# Patient Record
Sex: Female | Born: 1985 | Race: White | Hispanic: No | Marital: Single | Smoking: Former smoker
Health system: Southern US, Community
[De-identification: ages and names within clinical notes are randomized; demographics above are authoritative.]

## PROBLEM LIST (undated history)

## (undated) ENCOUNTER — Inpatient Hospital Stay (HOSPITAL_COMMUNITY): Payer: Self-pay

## (undated) DIAGNOSIS — Z789 Other specified health status: Secondary | ICD-10-CM

## (undated) HISTORY — PX: OTHER SURGICAL HISTORY: SHX169

## (undated) HISTORY — PX: WISDOM TOOTH EXTRACTION: SHX21

---
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
## 2010-03-25 ENCOUNTER — Emergency Department (HOSPITAL_COMMUNITY): Admission: EM | Admit: 2010-03-25 | Discharge: 2010-03-25 | Payer: Self-pay | Admitting: Emergency Medicine

## 2010-09-03 LAB — POCT URINALYSIS DIPSTICK
Bilirubin Urine: NEGATIVE
Ketones, ur: NEGATIVE mg/dL
Protein, ur: NEGATIVE mg/dL
Specific Gravity, Urine: <=1.005 (ref 1.005–1.030)
pH: 6 (ref 5.0–8.0)

## 2010-09-03 LAB — URINE CULTURE
Colony Count: NO GROWTH
Culture  Setup Time: 201110051459

## 2010-09-03 LAB — HIV ANTIBODY (ROUTINE TESTING W REFLEX): HIV: NONREACTIVE

## 2010-09-03 LAB — POCT PREGNANCY, URINE: Preg Test, Ur: NEGATIVE

## 2010-09-03 LAB — HERPES SIMPLEX VIRUS CULTURE: Culture: DETECTED

## 2010-09-03 LAB — RPR: RPR Ser Ql: NONREACTIVE

## 2012-02-16 ENCOUNTER — Telehealth (HOSPITAL_COMMUNITY): Payer: Self-pay | Admitting: *Deleted

## 2012-02-16 NOTE — ED Notes (Signed)
Brandi from the [facility] called and requested pt.'s HSV results from 2 yrs ago- for continuity of care. She asked me to fax it to her. I accessed pt.'s chart and call her the result and faxed it to [phone] and confirmation received.

## 2012-02-18 ENCOUNTER — Ambulatory Visit (INDEPENDENT_AMBULATORY_CARE_PROVIDER_SITE_OTHER): Payer: 59 | Admitting: Family Medicine

## 2012-02-18 VITALS — BP 120/88 | HR 66 | Temp 97.8°F | Resp 16 | Ht 67.5 in | Wt 160.0 lb

## 2012-02-18 DIAGNOSIS — A749 Chlamydial infection, unspecified: Secondary | ICD-10-CM

## 2012-02-18 MED ORDER — AZITHROMYCIN 500 MG PO TABS: ORAL_TABLET | ORAL | Status: DC

## 2012-02-18 NOTE — Patient Instructions (Addendum)
Chlamydia, Female Chlamydia is an infection caused by bacteria. It is spread through sexual contact. Chlamydia can be in different areas of the body. These areas include the cervix, urethra, throat, or rectum. If you are infected, you must finish all treatments and follow up with a caregiver.  CAUSES  Chlamydia is a sexually transmitted disease. It is passed from an infected partner during intimate contact. This contact could be with the genitals, mouth, or rectal area. Infections can also be passed from mothers to babies during birth. SYMPTOMS  There may not be any symptoms. This is often the case early in the infection. Symptoms you may notice include:  Mild pain and discomfort when urinating.   Inflammation of the rectum.   Vaginal discharge.   Painful intercourse.   Abdominal pain.   Bleeding between menstrual periods.  DIAGNOSIS  To diagnose this infection, your caregiver will do a pelvic exam. Cultures will be taken of the vagina, cervix, urine, and possibly the rectum to see if the infection is chlamydia. TREATMENT You will be given antibiotic medicines. Any sexual partners should also be treated, even if they do not show symptoms. Take the medicine for the prescribed length of time. If you are pregnant, do not take tetracycline-type antibiotics. HOME CARE INSTRUCTIONS   Take your antibiotics as directed. Finish them even if you start to feel better.   Only take over-the-counter or prescription medicines for pain, discomfort, or fever as directed by your caregiver.   Inform any sexual partners about the infection. They should be treated also.   Do not have sexual contact until your caregiver tells you it is okay.   Get plenty of rest.   Eat a well-balanced diet, and drink enough fluids to keep your urine clear or pale yellow.   Keep all follow-up appointments and tests.  SEEK IMMEDIATE MEDICAL CARE IF:   Your symptoms return.   You have a fever.  MAKE SURE YOU:    Understand these instructions.   Will watch your condition.   Will get help right away if you are not doing well or get worse.  Document Released: 03/17/2005 Document Revised: 05/27/2011 Document Reviewed: 01/24/2008 Exit[employer] Patient Information 2012 [employer]. 

## 2012-02-18 NOTE — Progress Notes (Signed)
Subjective: 26 year old patient who presents with a history of having Chlamydia exposure. Her sexual contact was treated a few days ago, and she went to the health Department. She tested positive, but they said they could not treat her until next. They did full STD testing on her apparently. He was treated with a single dose therapy regimen already. He is in [place] so they do not have sex regularly.  She is otherwise healthy.  Objective: Pleasant young lady in no acute distress. Exam not done since she just was tested.  Assessment: Chlamydia cervicitis  Plan: Treated her with azithromycin 500 mg, 2 tablets stat  Plan to have her return if further problems

## 2013-05-30 ENCOUNTER — Other Ambulatory Visit (HOSPITAL_COMMUNITY)
Admission: RE | Admit: 2013-05-30 | Discharge: 2013-05-30 | Disposition: A | Discharge status: Home / Self Care | Payer: 59 | Source: Ambulatory Visit | Attending: Obstetrics & Gynecology | Admitting: Obstetrics & Gynecology

## 2013-05-30 ENCOUNTER — Other Ambulatory Visit: Payer: Self-pay | Admitting: Obstetrics & Gynecology

## 2013-05-30 DIAGNOSIS — Z01419 Encounter for gynecological examination (general) (routine) without abnormal findings: Secondary | ICD-10-CM | POA: Insufficient documentation

## 2013-05-30 DIAGNOSIS — Z113 Encounter for screening for infections with a predominantly sexual mode of transmission: Secondary | ICD-10-CM | POA: Insufficient documentation

## 2015-05-07 ENCOUNTER — Inpatient Hospital Stay (HOSPITAL_COMMUNITY): Payer: BC Managed Care – PPO

## 2015-05-07 ENCOUNTER — Inpatient Hospital Stay (HOSPITAL_COMMUNITY)
Admission: AD | Admit: 2015-05-07 | Discharge: 2015-05-07 | Disposition: A | Discharge status: Home / Self Care | Payer: BC Managed Care – PPO | Source: Ambulatory Visit | Attending: Obstetrics & Gynecology | Admitting: Obstetrics & Gynecology

## 2015-05-07 ENCOUNTER — Encounter (HOSPITAL_COMMUNITY): Payer: Self-pay | Admitting: Medical

## 2015-05-07 DIAGNOSIS — R1030 Lower abdominal pain, unspecified: Secondary | ICD-10-CM | POA: Diagnosis not present

## 2015-05-07 DIAGNOSIS — R109 Unspecified abdominal pain: Secondary | ICD-10-CM | POA: Diagnosis not present

## 2015-05-07 DIAGNOSIS — Z3A01 Less than 8 weeks gestation of pregnancy: Secondary | ICD-10-CM | POA: Diagnosis not present

## 2015-05-07 DIAGNOSIS — O9989 Other specified diseases and conditions complicating pregnancy, childbirth and the puerperium: Secondary | ICD-10-CM | POA: Diagnosis not present

## 2015-05-07 DIAGNOSIS — O26891 Other specified pregnancy related conditions, first trimester: Secondary | ICD-10-CM | POA: Diagnosis not present

## 2015-05-07 DIAGNOSIS — O26899 Other specified pregnancy related conditions, unspecified trimester: Secondary | ICD-10-CM

## 2015-05-07 HISTORY — DX: Other specified health status: Z78.9

## 2015-05-07 LAB — CBC WITH DIFFERENTIAL/PLATELET
Basophils Absolute: 0 10*3/uL (ref 0.0–0.1)
Basophils Relative: 0 %
Eosinophils Absolute: 0.1 10*3/uL (ref 0.0–0.7)
Eosinophils Relative: 1 %
HEMATOCRIT: 33.6 % — AB (ref 36.0–46.0)
HEMOGLOBIN: 12 g/dL (ref 12.0–15.0)
LYMPHS ABS: 2.5 10*3/uL (ref 0.7–4.0)
LYMPHS PCT: 28 %
MCH: 30.5 pg (ref 26.0–34.0)
MCHC: 35.7 g/dL (ref 30.0–36.0)
MCV: 85.5 fL (ref 78.0–100.0)
MONO ABS: 0.7 10*3/uL (ref 0.1–1.0)
MONOS PCT: 8 %
NEUTROS ABS: 5.4 10*3/uL (ref 1.7–7.7)
NEUTROS PCT: 63 %
Platelets: 213 10*3/uL (ref 150–400)
RBC: 3.93 MIL/uL (ref 3.87–5.11)
RDW: 12.1 % (ref 11.5–15.5)
WBC: 8.7 10*3/uL (ref 4.0–10.5)

## 2015-05-07 LAB — URINE MICROSCOPIC-ADD ON: Bacteria, UA: NONE SEEN

## 2015-05-07 LAB — URINALYSIS, ROUTINE W REFLEX MICROSCOPIC
Bilirubin Urine: NEGATIVE
GLUCOSE, UA: NEGATIVE mg/dL
Ketones, ur: NEGATIVE mg/dL
NITRITE: NEGATIVE
PH: 7.5 (ref 5.0–8.0)
PROTEIN: NEGATIVE mg/dL
SPECIFIC GRAVITY, URINE: 1.01 (ref 1.005–1.030)

## 2015-05-07 LAB — HCG, QUANTITATIVE, PREGNANCY: HCG, BETA CHAIN, QUANT, S: 11651 m[IU]/mL — AB (ref <5)

## 2015-05-07 LAB — WET PREP, GENITAL
CLUE CELLS WET PREP: NONE SEEN
SPERM: NONE SEEN
Trich, Wet Prep: NONE SEEN
Yeast Wet Prep HPF POC: NONE SEEN

## 2015-05-07 LAB — POCT PREGNANCY, URINE: Preg Test, Ur: POSITIVE — AB

## 2015-05-07 NOTE — MAU Note (Signed)
Pt reports 2 positive home preg tests, lower mid abd pain off/on x 2 days. Urinary freq, denies dysuria.

## 2015-05-07 NOTE — Discharge Instructions (Signed)
First Trimester of Pregnancy The first trimester of pregnancy is from week 1 until the end of week 12 (months 1 through 3). A week after a sperm fertilizes an egg, the egg will implant on the wall of the uterus. This embryo will begin to develop into a baby. Genes from you and your partner are forming the baby. The female genes determine whether the baby is a boy or a girl. At 6-8 weeks, the eyes and face are formed, and the heartbeat can be seen on ultrasound. At the end of 12 weeks, all the baby's organs are formed.  Now that you are pregnant, you will want to do everything you can to have a healthy baby. Two of the most important things are to get good prenatal care and to follow your health care provider's instructions. Prenatal care is all the medical care you receive before the baby's birth. This care will help prevent, find, and treat any problems during the pregnancy and childbirth. BODY CHANGES Your body goes through many changes during pregnancy. The changes vary from woman to woman.   You may gain or lose a couple of pounds at first.  You may feel sick to your stomach (nauseous) and throw up (vomit). If the vomiting is uncontrollable, call your health care provider.  You may tire easily.  You may develop headaches that can be relieved by medicines approved by your health care provider.  You may urinate more often. Painful urination may mean you have a bladder infection.  You may develop heartburn as a result of your pregnancy.  You may develop constipation because certain hormones are causing the muscles that push waste through your intestines to slow down.  You may develop hemorrhoids or swollen, bulging veins (varicose veins).  Your breasts may begin to grow larger and become tender. Your nipples may stick out more, and the tissue that surrounds them (areola) may become darker.  Your gums may bleed and may be sensitive to brushing and flossing.  Dark spots or blotches  (chloasma, mask of pregnancy) may develop on your face. This will likely fade after the baby is born.  Your menstrual periods will stop.  You may have a loss of appetite.  You may develop cravings for certain kinds of food.  You may have changes in your emotions from day to day, such as being excited to be pregnant or being concerned that something may go wrong with the pregnancy and baby.  You may have more vivid and strange dreams.  You may have changes in your hair. These can include thickening of your hair, rapid growth, and changes in texture. Some women also have hair loss during or after pregnancy, or hair that feels dry or thin. Your hair will most likely return to normal after your baby is born. WHAT TO EXPECT AT YOUR PRENATAL VISITS During a routine prenatal visit:  You will be weighed to make sure you and the baby are growing normally.  Your blood pressure will be taken.  Your abdomen will be measured to track your baby's growth.  The fetal heartbeat will be listened to starting around week 10 or 12 of your pregnancy.  Test results from any previous visits will be discussed. Your health care provider may ask you:  How you are feeling.  If you are feeling the baby move.  If you have had any abnormal symptoms, such as leaking fluid, bleeding, severe headaches, or abdominal cramping.  If you are using any tobacco products,   including cigarettes, chewing tobacco, and electronic cigarettes.  If you have any questions. Other tests that may be performed during your first trimester include:  Blood tests to find your blood type and to check for the presence of any previous infections. They will also be used to check for low iron levels (anemia) and Rh antibodies. Later in the pregnancy, blood tests for diabetes will be done along with other tests if problems develop.  Urine tests to check for infections, diabetes, or protein in the urine.  An ultrasound to confirm the  proper growth and development of the baby.  An amniocentesis to check for possible genetic problems.  Fetal screens for spina bifida and Down syndrome.  You may need other tests to make sure you and the baby are doing well.  HIV (human immunodeficiency virus) testing. Routine prenatal testing includes screening for HIV, unless you choose not to have this test. HOME CARE INSTRUCTIONS  Medicines  Follow your health care provider's instructions regarding medicine use. Specific medicines may be either safe or unsafe to take during pregnancy.  Take your prenatal vitamins as directed.  If you develop constipation, try taking a stool softener if your health care provider approves. Diet  Eat regular, well-balanced meals. Choose a variety of foods, such as meat or vegetable-based protein, fish, milk and low-fat dairy products, vegetables, fruits, and whole grain breads and cereals. Your health care provider will help you determine the amount of weight gain that is right for you.  Avoid raw meat and uncooked cheese. These carry germs that can cause birth defects in the baby.  Eating four or five small meals rather than three large meals a day may help relieve nausea and vomiting. If you start to feel nauseous, eating a few soda crackers can be helpful. Drinking liquids between meals instead of during meals also seems to help nausea and vomiting.  If you develop constipation, eat more high-fiber foods, such as fresh vegetables or fruit and whole grains. Drink enough fluids to keep your urine clear or pale yellow. Activity and Exercise  Exercise only as directed by your health care provider. Exercising will help you:  Control your weight.  Stay in shape.  Be prepared for labor and delivery.  Experiencing pain or cramping in the lower abdomen or low back is a good sign that you should stop exercising. Check with your health care provider before continuing normal exercises.  Try to avoid  standing for long periods of time. Move your legs often if you must stand in one place for a long time.  Avoid heavy lifting.  Wear low-heeled shoes, and practice good posture.  You may continue to have sex unless your health care provider directs you otherwise. Relief of Pain or Discomfort  Wear a good support bra for breast tenderness.   Take warm sitz baths to soothe any pain or discomfort caused by hemorrhoids. Use hemorrhoid cream if your health care provider approves.   Rest with your legs elevated if you have leg cramps or low back pain.  If you develop varicose veins in your legs, wear support hose. Elevate your feet for 15 minutes, 3-4 times a day. Limit salt in your diet. Prenatal Care  Schedule your prenatal visits by the twelfth week of pregnancy. They are usually scheduled monthly at first, then more often in the last 2 months before delivery.  Write down your questions. Take them to your prenatal visits.  Keep all your prenatal visits as directed by your   health care provider. Safety  Wear your seat belt at all times when driving.  Make a list of emergency phone numbers, including numbers for family, friends, the hospital, and police and fire departments. General Tips  Ask your health care provider for a referral to a local prenatal education class. Begin classes no later than at the beginning of month 6 of your pregnancy.  Ask for help if you have counseling or nutritional needs during pregnancy. Your health care provider can offer advice or refer you to specialists for help with various needs.  Do not use hot tubs, steam rooms, or saunas.  Do not douche or use tampons or scented sanitary pads.  Do not cross your legs for long periods of time.  Avoid cat litter boxes and soil used by cats. These carry germs that can cause birth defects in the baby and possibly loss of the fetus by miscarriage or stillbirth.  Avoid all smoking, herbs, alcohol, and medicines  not prescribed by your health care provider. Chemicals in these affect the formation and growth of the baby.  Do not use any tobacco products, including cigarettes, chewing tobacco, and electronic cigarettes. If you need help quitting, ask your health care provider. You may receive counseling support and other resources to help you quit.  Schedule a dentist appointment. At home, brush your teeth with a soft toothbrush and be gentle when you floss. SEEK MEDICAL CARE IF:   You have dizziness.  You have mild pelvic cramps, pelvic pressure, or nagging pain in the abdominal area.  You have persistent nausea, vomiting, or diarrhea.  You have a bad smelling vaginal discharge.  You have pain with urination.  You notice increased swelling in your face, hands, legs, or ankles. SEEK IMMEDIATE MEDICAL CARE IF:   You have a fever.  You are leaking fluid from your vagina.  You have spotting or bleeding from your vagina.  You have severe abdominal cramping or pain.  You have rapid weight gain or loss.  You vomit blood or material that looks like coffee grounds.  You are exposed to German measles and have never had them.  You are exposed to fifth disease or chickenpox.  You develop a severe headache.  You have shortness of breath.  You have any kind of trauma, such as from a fall or a car accident.   This information is not intended to replace advice given to you by your health care provider. Make sure you discuss any questions you have with your health care provider.   Document Released: 06/01/2001 Document Revised: 06/28/2014 Document Reviewed: 04/17/2013 Elsevier Interactive Patient Education 2016 Else[employer].  Safe Medications in Pregnancy   Acne: Benzoyl Peroxide Salicylic Acid  Backache/Headache: Tylenol: 2 regular strength every 4 hours OR              2 Extra strength every 6 hours  Colds/Coughs/Allergies: Benadryl (alcohol free) 25 mg every 6 hours as  needed Breath right strips Claritin Cepacol throat lozenges Chloraseptic throat spray Cold-Eeze- up to three times per day Cough drops, alcohol free Flonase (by prescription only) Guaifenesin Mucinex Robitussin DM (plain only, alcohol free) Saline nasal spray/drops Sudafed (pseudoephedrine) & Actifed ** use only after [redacted] weeks gestation and if you do not have high blood pressure Tylenol Vicks Vaporub Zinc lozenges Zyrtec   Constipation: Colace Ducolax suppositories Fleet enema Glycerin suppositories Metamucil Milk of magnesia Miralax Senokot Smooth move tea  Diarrhea: Kaopectate Imodium A-D  *NO pepto Bismol  Hemorrhoids: Anusol Anusol   HC Preparation H Tucks  Indigestion: Tums Maalox Mylanta Zantac  Pepcid  Insomnia: Benadryl (alcohol free) 25mg every 6 hours as needed Tylenol PM Unisom, no Gelcaps  Leg Cramps: Tums MagGel  Nausea/Vomiting:  Bonine Dramamine Emetrol Ginger extract Sea bands Meclizine  Nausea medication to take during pregnancy:  Unisom (doxylamine succinate 25 mg tablets) Take one tablet daily at bedtime. If symptoms are not adequately controlled, the dose can be increased to a maximum recommended dose of two tablets daily (1/2 tablet in the morning, 1/2 tablet mid-afternoon and one at bedtime). Vitamin B6 100mg tablets. Take one tablet twice a day (up to 200 mg per day).  Skin Rashes: Aveeno products Benadryl cream or 25mg every 6 hours as needed Calamine Lotion 1% cortisone cream  Yeast infection: Gyne-lotrimin 7 Monistat 7   **If taking multiple medications, please check labels to avoid duplicating the same active ingredients **take medication as directed on the label ** Do not exceed 4000 mg of tylenol in 24 hours **Do not take medications that contain aspirin or ibuprofen    

## 2015-05-07 NOTE — MAU Provider Note (Signed)
History     CSN: [id number]  Arrival date and time: 05/07/15 1844   First Provider Initiated Contact with Patient 05/07/15 1929      No chief complaint on file.  HPI  [name] is a 29 y.o. G1P0 at [redacted]w[redacted]d who presents to MAU today with complaint of lower abdominal pain x 2-3 days. The patient had recent +HPT. She denies abnormal discharge, vaginal bleeding, UTI symptoms, N/V/D or constipation or fever. She rates pain at 5/10 now. States it has been intermittent since onset. She has not taken anything for pain.   OB History    Gravida Para Term Preterm AB TAB SAB Ectopic Multiple Living   1               No past medical history on file.  No past surgical history on file.  No family history on file.  Social History  Substance Use Topics  . Smoking status: Former Games developer  . Smokeless tobacco: Not on file  . Alcohol Use: Not on file    Allergies: No Known Allergies  Prescriptions prior to admission  Medication Sig Dispense Refill Last Dose  . azithromycin (ZITHROMAX) 500 MG tablet Take 2 tablets single dose for chlamydia treatment 2 tablet 0   . escitalopram (LEXAPRO) 10 MG tablet Take 10 mg by mouth daily.   Taking    Review of Systems  Constitutional: Negative for fever and malaise/fatigue.  Gastrointestinal: Positive for abdominal pain. Negative for nausea, vomiting, diarrhea and constipation.  Genitourinary: Negative for dysuria, urgency and frequency.       Neg - vaginal bleeding, discharge   Physical Exam   Blood pressure 119/90, pulse 81, temperature 98.4 F (36.9 C), temperature source Oral, resp. rate 16, last menstrual period 03/24/2015, SpO2 100 %.  Physical Exam  Nursing note and vitals reviewed. Constitutional: She is oriented to person, place, and time. She appears well-developed and well-nourished. No distress.  HENT:  Head: Normocephalic and atraumatic.  Cardiovascular: Normal rate.   Respiratory: Effort normal.  GI: Soft. She  exhibits no distension and no mass. There is no tenderness. There is no rebound and no guarding.  Neurological: She is alert and oriented to person, place, and time.  Skin: Skin is warm and dry. No erythema.  Psychiatric: She has a normal mood and affect.   Results for orders placed or performed during the hospital encounter of 05/07/15 (from the past 24 hour(s))  Urinalysis, Routine w reflex microscopic (not at [facility])     Status: Abnormal   Collection Time: 05/07/15  7:31 PM  Result Value Ref Range   Color, Urine YELLOW YELLOW   APPearance CLEAR CLEAR   Specific Gravity, Urine 1.010 1.005 - 1.030   pH 7.5 5.0 - 8.0   Glucose, UA NEGATIVE NEGATIVE mg/dL   Hgb urine dipstick TRACE (A) NEGATIVE   Bilirubin Urine NEGATIVE NEGATIVE   Ketones, ur NEGATIVE NEGATIVE mg/dL   Protein, ur NEGATIVE NEGATIVE mg/dL   Nitrite NEGATIVE NEGATIVE   Leukocytes, UA SMALL (A) NEGATIVE  Urine microscopic-add on     Status: Abnormal   Collection Time: 05/07/15  7:31 PM  Result Value Ref Range   Squamous Epithelial / LPF 0-5 (A) NONE SEEN   WBC, UA 0-5 0 - 5 WBC/hpf   RBC / HPF 0-5 0 - 5 RBC/hpf   Bacteria, UA NONE SEEN NONE SEEN  Pregnancy, urine POC     Status: Abnormal   Collection Time: 05/07/15  7:40 PM  Result Value Ref Range   Preg Test, Ur POSITIVE (A) NEGATIVE  CBC with Differential/Platelet     Status: Abnormal   Collection Time: 05/07/15  7:49 PM  Result Value Ref Range   WBC 8.7 4.0 - 10.5 K/uL   RBC 3.93 3.87 - 5.11 MIL/uL   Hemoglobin 12.0 12.0 - 15.0 g/dL   HCT 16.1 (L) 09.6 - 04.5 %   MCV 85.5 78.0 - 100.0 fL   MCH 30.5 26.0 - 34.0 pg   MCHC 35.7 30.0 - 36.0 g/dL   RDW 40.9 81.1 - 91.4 %   Platelets 213 150 - 400 K/uL   Neutrophils Relative % 63 %   Neutro Abs 5.4 1.7 - 7.7 K/uL   Lymphocytes Relative 28 %   Lymphs Abs 2.5 0.7 - 4.0 K/uL   Monocytes Relative 8 %   Monocytes Absolute 0.7 0.1 - 1.0 K/uL   Eosinophils Relative 1 %   Eosinophils Absolute 0.1 0.0 - 0.7 K/uL    Basophils Relative 0 %   Basophils Absolute 0.0 0.0 - 0.1 K/uL  ABO/Rh     Status: None (Preliminary result)   Collection Time: 05/07/15  7:49 PM  Result Value Ref Range   ABO/RH(D) O POS   hCG, quantitative, pregnancy     Status: Abnormal   Collection Time: 05/07/15  7:49 PM  Result Value Ref Range   hCG, Beta Chain, Quant, S 11651 (H) <5 mIU/mL  Wet prep, genital     Status: Abnormal   Collection Time: 05/07/15  8:50 PM  Result Value Ref Range   Yeast Wet Prep HPF POC NONE SEEN NONE SEEN   Trich, Wet Prep NONE SEEN NONE SEEN   Clue Cells Wet Prep HPF POC NONE SEEN NONE SEEN   WBC, Wet Prep HPF POC MANY (A) NONE SEEN   Sperm NONE SEEN    US Ob Comp Less 14 Wks  05/07/2015  CLINICAL DATA:  Acute onset of lower abdominal pain. Initial encounter. EXAM: OBSTETRIC <14 WK [place] AND TRANSVAGINAL OB US TECHNIQUE: Both transabdominal and transvaginal ultrasound examinations were performed for complete evaluation of the gestation as well as the maternal uterus, adnexal regions, and pelvic cul-de-sac. Transvaginal technique was performed to assess early pregnancy. COMPARISON:  None. FINDINGS: Intrauterine gestational sac: Visualized/normal in shape. Yolk sac:  Yes Embryo:  No Cardiac Activity: N/A MSD: 1.10 cm   5 w   6  d Maternal uterus/adnexae: A small amount of subchorionic hemorrhage is noted, measuring 3.3 x 2.2 x 0.7 cm. The uterus is otherwise unremarkable in appearance. The ovaries are within normal limits. The right ovary measures 3.7 x 2.2 x 2.3 cm, while the left ovary measures 3.4 x 1.8 x 2.1 cm. No suspicious adnexal masses are seen; there is no evidence for ovarian torsion. No free fluid is seen in the pelvic cul-de-sac. IMPRESSION: 1. Single intrauterine gestational sac noted, with a mean sac diameter of 1.1 cm, corresponding to a gestational age of [redacted] weeks 6 days. This matches the gestational age by LMP of 6 weeks 2 days, reflecting an estimated date of delivery of December 29, 2015. 2. Small  amount of subchorionic hemorrhage noted. Electronically Signed   By: Roanna Raider M.D.   On: 05/07/2015 23:10   US Ob Transvaginal  05/07/2015  CLINICAL DATA:  Acute onset of lower abdominal pain. Initial encounter. EXAM: OBSTETRIC <14 WK [place] AND TRANSVAGINAL OB US TECHNIQUE: Both transabdominal and transvaginal ultrasound examinations were performed for complete evaluation of  the gestation as well as the maternal uterus, adnexal regions, and pelvic cul-de-sac. Transvaginal technique was performed to assess early pregnancy. COMPARISON:  None. FINDINGS: Intrauterine gestational sac: Visualized/normal in shape. Yolk sac:  Yes Embryo:  No Cardiac Activity: N/A MSD: 1.10 cm   5 w   6  d Maternal uterus/adnexae: A small amount of subchorionic hemorrhage is noted, measuring 3.3 x 2.2 x 0.7 cm. The uterus is otherwise unremarkable in appearance. The ovaries are within normal limits. The right ovary measures 3.7 x 2.2 x 2.3 cm, while the left ovary measures 3.4 x 1.8 x 2.1 cm. No suspicious adnexal masses are seen; there is no evidence for ovarian torsion. No free fluid is seen in the pelvic cul-de-sac. IMPRESSION: 1. Single intrauterine gestational sac noted, with a mean sac diameter of 1.1 cm, corresponding to a gestational age of [redacted] weeks 6 days. This matches the gestational age by LMP of 6 weeks 2 days, reflecting an estimated date of delivery of December 29, 2015. 2. Small amount of subchorionic hemorrhage noted. Electronically Signed   By: Roanna RaiderJeffery  Chang M.D.   On: 05/07/2015 23:10     MAU Course  Procedures None  MDM +UPT UA, wet prep, GC/chlamydia, CBC, ABO/Rh, quant hCG, HIV, RPR and US today to rule out ectopic pregnancy 2025 - patient waiting for US. [facility] CNM  Marny LowensteinJulie N Wenzel, PA-C  05/07/2015, 8:27 PM  Assessment and Plan   1. Abdominal pain affecting pregnancy, antepartum    DC home Comfort measures reviewed  1st Trimester precautions  Bleeding precautions RX:  none  Return to MAU as needed FU with OB as planned  Follow-up Information    [facility]   Contact information:   [address] [place] [id number] [phone]

## 2015-05-08 LAB — ABO/RH: ABO/RH(D): O POS

## 2015-05-08 LAB — GC/CHLAMYDIA PROBE AMP (~~LOC~~) NOT AT ARMC
CHLAMYDIA, DNA PROBE: NEGATIVE
NEISSERIA GONORRHEA: NEGATIVE

## 2015-05-08 LAB — RPR: RPR: NONREACTIVE

## 2015-05-08 LAB — HIV ANTIBODY (ROUTINE TESTING W REFLEX): HIV SCREEN 4TH GENERATION: NONREACTIVE

## 2015-06-22 NOTE — L&D Delivery Note (Signed)
Delivery Note At 3:27 PM a viable female was delivered via  (Presentation vertex: ; LOA ).  APGAR:8 , 9; weight  .   Placenta status:spont , 3vc.  Cord:  with the following complications:none .  Cord pH: n/a  Anesthesia:  epidural Episiotomy:  none Lacerations:  none Suture Repair: n/a Est. Blood Loss 100 (mL):    Mom to postpartum.  Baby to Couplet care / Skin to Skin.  Claudia Graves 12/28/2015, 3:39 PM

## 2015-07-17 ENCOUNTER — Inpatient Hospital Stay (HOSPITAL_COMMUNITY)
Admission: AD | Admit: 2015-07-17 | Discharge: 2015-07-17 | Disposition: A | Discharge status: Home / Self Care | Payer: BC Managed Care – PPO | Source: Ambulatory Visit | Attending: Obstetrics & Gynecology | Admitting: Obstetrics & Gynecology

## 2015-07-17 ENCOUNTER — Encounter (HOSPITAL_COMMUNITY): Payer: Self-pay | Admitting: *Deleted

## 2015-07-17 DIAGNOSIS — Z3A16 16 weeks gestation of pregnancy: Secondary | ICD-10-CM | POA: Insufficient documentation

## 2015-07-17 DIAGNOSIS — O26852 Spotting complicating pregnancy, second trimester: Secondary | ICD-10-CM | POA: Insufficient documentation

## 2015-07-17 DIAGNOSIS — N939 Abnormal uterine and vaginal bleeding, unspecified: Secondary | ICD-10-CM | POA: Diagnosis present

## 2015-07-17 DIAGNOSIS — Z87891 Personal history of nicotine dependence: Secondary | ICD-10-CM | POA: Insufficient documentation

## 2015-07-17 LAB — URINE MICROSCOPIC-ADD ON

## 2015-07-17 LAB — URINALYSIS, ROUTINE W REFLEX MICROSCOPIC
BILIRUBIN URINE: NEGATIVE
Glucose, UA: NEGATIVE mg/dL
Ketones, ur: NEGATIVE mg/dL
NITRITE: NEGATIVE
PROTEIN: NEGATIVE mg/dL
pH: 6.5 (ref 5.0–8.0)

## 2015-07-17 LAB — WET PREP, GENITAL
Sperm: NONE SEEN
Trich, Wet Prep: NONE SEEN
Yeast Wet Prep HPF POC: NONE SEEN

## 2015-07-17 LAB — OB RESULTS CONSOLE GC/CHLAMYDIA: Gonorrhea: NEGATIVE

## 2015-07-17 NOTE — Discharge Instructions (Signed)
Vaginal Bleeding During Pregnancy, Second Trimester A small amount of bleeding (spotting) from the vagina is relatively common in pregnancy. It usually stops on its own. Various things can cause bleeding or spotting in pregnancy. Some bleeding may be related to the pregnancy, and some may not. Sometimes the bleeding is normal and is not a problem. However, bleeding can also be a sign of something serious. Be sure to tell your health care provider about any vaginal bleeding right away. Some possible causes of vaginal bleeding during the second trimester include:  Infection, inflammation, or growths on the cervix.   The placenta may be partially or completely covering the opening of the cervix inside the uterus (placenta previa).  The placenta may have separated from the uterus (abruption of the placenta).   You may be having early (preterm) labor.   The cervix may not be strong enough to keep a baby inside the uterus (cervical insufficiency).   Tiny cysts may have developed in the uterus instead of pregnancy tissue (molar pregnancy). HOME CARE INSTRUCTIONS  Watch your condition for any changes. The following actions may help to lessen any discomfort you are feeling:  Follow your health care provider's instructions for limiting your activity. If your health care provider orders bed rest, you may need to stay in bed and only get up to use the bathroom. However, your health care provider may allow you to continue light activity.  If needed, make plans for someone to help with your regular activities and responsibilities while you are on bed rest.  Keep track of the number of pads you use each day, how often you change pads, and how soaked (saturated) they are. Write this down.  Do not use tampons. Do not douche.  Do not have sexual intercourse or orgasms until approved by your health care provider.  If you pass any tissue from your vagina, save the tissue so you can show it to your  health care provider.  Only take over-the-counter or prescription medicines as directed by your health care provider.  Do not take aspirin because it can make you bleed.  Do not exercise or perform any strenuous activities or heavy lifting without your health care provider's permission.  Keep all follow-up appointments as directed by your health care provider. SEEK MEDICAL CARE IF:  You have any vaginal bleeding during any part of your pregnancy.  You have cramps or labor pains.  You have a fever, not controlled by medicine. SEEK IMMEDIATE MEDICAL CARE IF:   You have severe cramps in your back or belly (abdomen).  You have contractions.  You have chills.  You pass large clots or tissue from your vagina.  Your bleeding increases.  You feel light-headed or weak, or you have fainting episodes.  You are leaking fluid or have a gush of fluid from your vagina. MAKE SURE YOU:  Understand these instructions.  Will watch your condition.  Will get help right away if you are not doing well or get worse.   This information is not intended to replace advice given to you by your health care provider. Make sure you discuss any questions you have with your health care provider.   Document Released: 03/17/2005 Document Revised: 06/12/2013 Document Reviewed: 02/12/2013 Elsevier Interactive Patient Education 2016 [employer]  

## 2015-07-17 NOTE — MAU Note (Signed)
PT SAYS SHE STARTED  HAVING  VAG  BLEEDING-   STARTED  AT 7PM-  WENT  TO B-ROOM-  WHEN  SHE  WIPED  - RED  BLOOD  ON  PAPER.      HAS AN APPOINTMENT   FOR  2-1  IN CLINIC.    IN TRIAGE    - NO   PAD - HAS ON  BLACK UNDERWEAR-   SAYS  SHE  FEELS   2 SPOTS  - UNSURE IF  BLOOD.       NO PAIN   NOW  - HAD A PAIN ON WAY  TO HOSPITAL.      LAST SEX-  3 WEEKS  AGO.

## 2015-07-17 NOTE — MAU Provider Note (Signed)
History     CSN: [id number]  Arrival date and time: 07/17/15 1956   First Provider Initiated Contact with Patient 07/17/15 2227      Chief Complaint  Patient presents with  . Vaginal Bleeding   Vaginal Bleeding The patient's primary symptoms include vaginal bleeding. The patient's pertinent negatives include no pelvic pain. This is a new problem. The current episode started today. The problem occurs rarely. The problem has been resolved. The patient is experiencing no pain. She is pregnant. Pertinent negatives include no abdominal pain, chills, constipation, diarrhea, dysuria, fever, frequency, nausea, urgency or vomiting. The vaginal discharge was bloody. The vaginal bleeding is spotting. She has not been passing clots. She has not been passing tissue. Nothing aggravates the symptoms. She has tried nothing for the symptoms. The treatment provided no relief. Sexual activity: No intercourse in last 24 hours     Past Medical History  Diagnosis Date  . Medical history non-contributory     Past Surgical History  Procedure Laterality Date  . Wisdom tooth extraction    . Other surgical history      gum tissue cliped between teeth    No family history on file.  Social History  Substance Use Topics  . Smoking status: Former Games developer  . Smokeless tobacco: None  . Alcohol Use: No    Allergies: No Known Allergies  Prescriptions prior to admission  Medication Sig Dispense Refill Last Dose  . acetaminophen (TYLENOL) 325 MG tablet Take 325 mg by mouth every 6 (six) hours as needed for headache.   Past Week at Unknown time  . Prenatal Vit-Fe Fumarate-FA (PRENATAL MULTIVITAMIN) TABS tablet Take 1 tablet by mouth daily at 12 noon.   07/16/2015 at Unknown time    Review of Systems  Constitutional: Negative for fever and chills.  Gastrointestinal: Negative for nausea, vomiting, abdominal pain, diarrhea and constipation.  Genitourinary: Positive for vaginal bleeding. Negative for dysuria,  urgency, frequency and pelvic pain.   Physical Exam   Blood pressure 105/72, pulse 81, temperature 98 F (36.7 C), temperature source Oral, resp. rate 20, height  (1.676 m), weight 80.4 kg (177 lb 4 oz), last menstrual period 03/24/2015.  Physical Exam  Nursing note and vitals reviewed. Constitutional: She is oriented to person, place, and time. She appears well-developed and well-nourished. No distress.  HENT:  Head: Normocephalic.  Cardiovascular: Normal rate.   Respiratory: Effort normal.  GI: Soft. There is no tenderness. There is no rebound.  Genitourinary:   External: no lesion Vagina: small amount of pink discharge Cervix: pink, smooth, no CMT Uterus: AGA, FHT 152 with doppler     Neurological: She is alert and oriented to person, place, and time.  Skin: Skin is warm and dry.  Psychiatric: She has a normal mood and affect.     Results for orders placed or performed during the hospital encounter of 07/17/15 (from the past 24 hour(s))  Urinalysis, Routine w reflex microscopic (not at [facility])     Status: Abnormal   Collection Time: 07/17/15  8:17 PM  Result Value Ref Range   Color, Urine YELLOW YELLOW   APPearance CLEAR CLEAR   Specific Gravity, Urine <1.005 (L) 1.005 - 1.030   pH 6.5 5.0 - 8.0   Glucose, UA NEGATIVE NEGATIVE mg/dL   Hgb urine dipstick MODERATE (A) NEGATIVE   Bilirubin Urine NEGATIVE NEGATIVE   Ketones, ur NEGATIVE NEGATIVE mg/dL   Protein, ur NEGATIVE NEGATIVE mg/dL   Nitrite NEGATIVE NEGATIVE   Leukocytes, UA  SMALL (A) NEGATIVE  Urine microscopic-add on     Status: Abnormal   Collection Time: 07/17/15  8:17 PM  Result Value Ref Range   Squamous Epithelial / LPF 0-5 (A) NONE SEEN   WBC, UA 6-30 0 - 5 WBC/hpf   RBC / HPF 0-5 0 - 5 RBC/hpf   Bacteria, UA FEW (A) NONE SEEN  Wet prep, genital     Status: Abnormal   Collection Time: 07/17/15 10:38 PM  Result Value Ref Range   Yeast Wet Prep HPF POC NONE SEEN NONE SEEN   Trich, Wet Prep NONE  SEEN NONE SEEN   Clue Cells Wet Prep HPF POC PRESENT (A) NONE SEEN   WBC, Wet Prep HPF POC MANY (A) NONE SEEN   Sperm NONE SEEN     MAU Course  Procedures  MDM   Assessment and Plan   1. Spotting affecting pregnancy in second trimester    DC home Comfort measures reviewed  2nd Trimester precautions  Bleeding precautions RX: none  Return to MAU as needed FU with OB as planned  Follow-up Information    Follow up with [facility].   Specialty:  Obstetrics and Gynecology   Why:  As scheduled   Contact information:   [address] [place] [address] [phone]        Tawnya Crook 07/17/2015, 10:32 PM

## 2015-07-18 LAB — GC/CHLAMYDIA PROBE AMP (~~LOC~~) NOT AT ARMC
Chlamydia: NEGATIVE
NEISSERIA GONORRHEA: NEGATIVE

## 2015-07-23 ENCOUNTER — Encounter: Payer: Self-pay | Admitting: Advanced Practice Midwife

## 2015-07-23 ENCOUNTER — Ambulatory Visit (INDEPENDENT_AMBULATORY_CARE_PROVIDER_SITE_OTHER): Payer: BC Managed Care – PPO | Admitting: Advanced Practice Midwife

## 2015-07-23 VITALS — BP 121/85 | HR 86 | Temp 98.0°F | Wt 175.0 lb

## 2015-07-23 DIAGNOSIS — Z8619 Personal history of other infectious and parasitic diseases: Secondary | ICD-10-CM | POA: Insufficient documentation

## 2015-07-23 DIAGNOSIS — Z3480 Encounter for supervision of other normal pregnancy, unspecified trimester: Secondary | ICD-10-CM | POA: Insufficient documentation

## 2015-07-23 DIAGNOSIS — Z3482 Encounter for supervision of other normal pregnancy, second trimester: Secondary | ICD-10-CM

## 2015-07-23 LAB — POCT URINALYSIS DIP (DEVICE)
BILIRUBIN URINE: NEGATIVE
GLUCOSE, UA: NEGATIVE mg/dL
KETONES UR: NEGATIVE mg/dL
NITRITE: NEGATIVE
Protein, ur: NEGATIVE mg/dL
Specific Gravity, Urine: 1.02 (ref 1.005–1.030)
Urobilinogen, UA: 0.2 mg/dL (ref 0.0–1.0)
pH: 6.5 (ref 5.0–8.0)

## 2015-07-23 NOTE — Progress Notes (Signed)
Subjective:  Claudia Graves is a 30 y.o. G3P1011 at [redacted]w[redacted]d being seen today for initial prenatal visit.  She is currently monitored for the following issues for this low-risk pregnancy and has Supervision of normal subsequent pregnancy and Hx of herpes genitalis on her problem list.  Patient reports no complaints.  Contractions: Not present. Vag. Bleeding: None.  Movement: Absent. Denies leaking of fluid.   The following portions of the patient's history were reviewed and updated as appropriate: allergies, current medications, past family history, past medical history, past social history, past surgical history and problem list. Problem list updated.  Objective:   Filed Vitals:   07/23/15 0817  BP: 121/85  Pulse: 86  Temp: 98 F (36.7 C)  Weight: 175 lb (79.379 kg)    Fetal Status: Fetal Heart Rate (bpm): 140 Fundal Height: 16 cm Movement: Absent     General:  Alert, oriented and cooperative. Patient is in no acute distress.  Skin: Skin is warm and dry. No rash noted.   Cardiovascular: Normal heart rate noted  Respiratory: Normal respiratory effort, no problems with respiration noted  Abdomen: Soft, gravid, appropriate for gestational age. Pain/Pressure: Absent     Pelvic: Vag. Bleeding: None     Cervical exam deferred       Last Pap 2015  Extremities: Normal range of motion.  Edema: None  Mental Status: Normal mood and affect. Normal behavior. Normal judgment and thought content.   Urinalysis:      Assessment and Plan:  Pregnancy: G3P1011 at [redacted]w[redacted]d  1. Supervision of normal pregnancy, antepartum, second trimester  - Antibody screen; Future - Hepatitis B Surface AntiGEN - Rubella Antibody, IgM - Prescript Monitor Profile(19) - Culture, OB Urine - [place] MFM OB COMP + 14 WK; Future - Antibody screen  --Declines Quad/AFP, anatomy US ordered  Preterm labor symptoms and general obstetric precautions including but not limited to vaginal bleeding, contractions, leaking of fluid  and fetal movement were reviewed in detail with the patient. Please refer to After Visit Summary for other counseling recommendations.  No Follow-up on file.   Hurshel Party, CNM

## 2015-07-23 NOTE — Progress Notes (Signed)
Last pap Dec 2015 normal per patient.  Pt had bleeding last week, went to MAU.  Pt refuses flu shot.

## 2015-07-24 LAB — PRESCRIPTION MONITORING PROFILE (19 PANEL)
Amphetamine/Meth: NEGATIVE ng/mL
BENZODIAZEPINE SCREEN, URINE: NEGATIVE ng/mL
Barbiturate Screen, Urine: NEGATIVE ng/mL
Buprenorphine, Urine: NEGATIVE ng/mL
CARISOPRODOL, URINE: NEGATIVE ng/mL
COCAINE METABOLITES: NEGATIVE ng/mL
Cannabinoid Scrn, Ur: NEGATIVE ng/mL
Creatinine, Urine: 209.18 mg/dL (ref >20.0)
ECSTASY: NEGATIVE ng/mL
FENTANYL URINE: NEGATIVE ng/mL
MEPERIDINE UR: NEGATIVE ng/mL
Methadone Screen, Urine: NEGATIVE ng/mL
Methaqualone: NEGATIVE ng/mL
NITRITES URINE, INITIAL: NEGATIVE ug/mL
OXYCODONE SCRN UR: NEGATIVE ng/mL
Opiate Screen, Urine: NEGATIVE ng/mL
PH URINE, INITIAL: 6.7 pH (ref 4.5–8.9)
PROPOXYPHENE: NEGATIVE ng/mL
Phencyclidine, Ur: NEGATIVE ng/mL
TRAMADOL UR: NEGATIVE ng/mL
Tapentadol, urine: NEGATIVE ng/mL
ZOLPIDEM, URINE: NEGATIVE ng/mL

## 2015-07-24 LAB — HEPATITIS B SURFACE ANTIGEN: Hepatitis B Surface Ag: NEGATIVE

## 2015-07-24 LAB — ANTIBODY SCREEN: ANTIBODY SCREEN: NEGATIVE

## 2015-07-25 LAB — CULTURE, OB URINE

## 2015-07-26 LAB — RUBELLA ANTIBODY, IGM: Rubella IgM: 0.61 (ref <0.91)

## 2015-08-19 ENCOUNTER — Ambulatory Visit (HOSPITAL_COMMUNITY)
Admission: RE | Admit: 2015-08-19 | Discharge: 2015-08-19 | Disposition: A | Discharge status: Home / Self Care | Payer: BC Managed Care – PPO | Source: Ambulatory Visit | Attending: Advanced Practice Midwife | Admitting: Advanced Practice Midwife

## 2015-08-19 ENCOUNTER — Other Ambulatory Visit: Payer: Self-pay | Admitting: Advanced Practice Midwife

## 2015-08-19 DIAGNOSIS — Z36 Encounter for antenatal screening of mother: Secondary | ICD-10-CM | POA: Insufficient documentation

## 2015-08-19 DIAGNOSIS — Z3482 Encounter for supervision of other normal pregnancy, second trimester: Secondary | ICD-10-CM

## 2015-08-19 DIAGNOSIS — Z3A21 21 weeks gestation of pregnancy: Secondary | ICD-10-CM | POA: Diagnosis not present

## 2015-08-19 DIAGNOSIS — O4442 Low lying placenta NOS or without hemorrhage, second trimester: Secondary | ICD-10-CM

## 2015-08-20 ENCOUNTER — Ambulatory Visit (INDEPENDENT_AMBULATORY_CARE_PROVIDER_SITE_OTHER): Payer: BC Managed Care – PPO | Admitting: Obstetrics and Gynecology

## 2015-08-20 VITALS — BP 111/75 | HR 86 | Temp 98.2°F | Wt 182.4 lb

## 2015-08-20 DIAGNOSIS — Z3482 Encounter for supervision of other normal pregnancy, second trimester: Secondary | ICD-10-CM | POA: Diagnosis not present

## 2015-08-20 DIAGNOSIS — O4442 Low lying placenta NOS or without hemorrhage, second trimester: Secondary | ICD-10-CM | POA: Insufficient documentation

## 2015-08-20 DIAGNOSIS — Z113 Encounter for screening for infections with a predominantly sexual mode of transmission: Secondary | ICD-10-CM

## 2015-08-20 LAB — POCT URINALYSIS DIP (DEVICE)
BILIRUBIN URINE: NEGATIVE
Bilirubin Urine: NEGATIVE
Glucose, UA: NEGATIVE mg/dL
Glucose, UA: NEGATIVE mg/dL
HGB URINE DIPSTICK: NEGATIVE
HGB URINE DIPSTICK: NEGATIVE
KETONES UR: NEGATIVE mg/dL
KETONES UR: NEGATIVE mg/dL
Leukocytes, UA: NEGATIVE
Leukocytes, UA: NEGATIVE
Nitrite: NEGATIVE
Nitrite: NEGATIVE
PH: 7 (ref 5.0–8.0)
PROTEIN: NEGATIVE mg/dL
PROTEIN: NEGATIVE mg/dL
SPECIFIC GRAVITY, URINE: 1.025 (ref 1.005–1.030)
SPECIFIC GRAVITY, URINE: 1.02 (ref 1.005–1.030)
UROBILINOGEN UA: 1 mg/dL (ref 0.0–1.0)
Urobilinogen, UA: 1 mg/dL (ref 0.0–1.0)
pH: 7 (ref 5.0–8.0)

## 2015-08-20 NOTE — Progress Notes (Signed)
Anatomy US done yesterday.  Having reflux and vomiting in the morning.

## 2015-08-20 NOTE — Progress Notes (Signed)
Subjective:  Claudia Graves is a 30 y.o. G3P1011 at [redacted]w[redacted]d being seen today for ongoing prenatal care.  She is currently monitored for the following issues for this low-risk pregnancy and has Supervision of normal subsequent pregnancy; Hx of herpes genitalis; and Low-lying placenta in second trimester on her problem list.  Patient reports that 2 days ago experienced pink/brown discharge. None today. No fevers, no pain.  Contractions: Not present. Vag. Bleeding: None.  Movement: Present. Denies leaking of fluid.   The following portions of the patient's history were reviewed and updated as appropriate: allergies, current medications, past family history, past medical history, past social history, past surgical history and problem list. Problem list updated.  Objective:   Filed Vitals:   08/20/15 0753  BP: 111/75  Pulse: 86  Temp: 98.2 F (36.8 C)  Weight: 182 lb 6.4 oz (82.736 kg)    Fetal Status: Fetal Heart Rate (bpm): 147   Movement: Present     General:  Alert, oriented and cooperative. Patient is in no acute distress.  Skin: Skin is warm and dry. No rash noted.   Cardiovascular: Normal heart rate noted  Respiratory: Normal respiratory effort, no problems with respiration noted  Abdomen: Soft, gravid, appropriate for gestational age. Pain/Pressure: Absent     Pelvic: Vag. Bleeding: None     Normal vagina and cervix, moderate amount white discharge  Extremities: Normal range of motion.  Edema: None  Mental Status: Normal mood and affect. Normal behavior. Normal judgment and thought content.   Urinalysis:      Assessment and Plan:  Pregnancy: G3P1011 at [redacted]w[redacted]d  1. Encounter for supervision of other normal pregnancy in second trimester - declines flu  # Spotting - normal exam today, no signs bleeding - urinalysis, wet prep, and gonorrhea/chlamydia - bleeding return precautions  # low lying placenta - see above - bleeding return precautions, pelvic rest, and ordering f/u  u/s for 4 weeks  Preterm labor symptoms and general obstetric precautions including but not limited to vaginal bleeding, contractions, leaking of fluid and fetal movement were reviewed in detail with the patient. Please refer to After Visit Summary for other counseling recommendations.  F/u 4 wks  Kathrynn Running, MD

## 2015-08-21 LAB — GC/CHLAMYDIA PROBE AMP (~~LOC~~) NOT AT ARMC
Chlamydia: NEGATIVE
Neisseria Gonorrhea: NEGATIVE

## 2015-08-21 LAB — WET PREP, GENITAL
TRICH WET PREP: NONE SEEN
YEAST WET PREP: NONE SEEN

## 2015-08-21 MED ORDER — METRONIDAZOLE 500 MG PO TABS: 500.0000 mg | ORAL_TABLET | Freq: Two times a day (BID) | ORAL | Status: DC

## 2015-08-21 NOTE — Addendum Note (Signed)
Addended by: Shonna Chock B on: 08/21/2015 12:19 PM   Modules accepted: Orders

## 2015-08-21 NOTE — Progress Notes (Signed)
Addendum: BV positive - flagyl

## 2015-08-22 ENCOUNTER — Telehealth: Payer: Self-pay | Admitting: *Deleted

## 2015-08-22 NOTE — Telephone Encounter (Signed)
Called pt and informed her of wet prep showing +BV. I explained the condition to pt's satisfaction. A prescription has been sent to her pharmacy which she may pick up today. Pt voiced understanding of all information and instructions given.

## 2015-08-29 ENCOUNTER — Encounter: Payer: Self-pay | Admitting: *Deleted

## 2015-08-29 ENCOUNTER — Telehealth: Payer: Self-pay | Admitting: *Deleted

## 2015-08-29 NOTE — Telephone Encounter (Signed)
Letter faxed.

## 2015-08-29 NOTE — Telephone Encounter (Signed)
Patient called and left message, stated she spoke to someone in the clinic yesterday because her tooth broke off. She has a dental appointment tomorrow but was told by the dentist that she needs a letter faxed to them. Please call.

## 2015-08-29 NOTE — Telephone Encounter (Signed)
Spoke with patient who has an appointment at [facility] tomorrow. She states she was told by that office that they need a letter form her ob/gyn office stating "what could and couldn't be done". Fax # is [phone]. Will fax letter today.

## 2015-09-17 ENCOUNTER — Ambulatory Visit (INDEPENDENT_AMBULATORY_CARE_PROVIDER_SITE_OTHER): Payer: BC Managed Care – PPO | Admitting: Family

## 2015-09-17 ENCOUNTER — Ambulatory Visit (HOSPITAL_COMMUNITY)
Admission: RE | Admit: 2015-09-17 | Discharge: 2015-09-17 | Disposition: A | Discharge status: Home / Self Care | Payer: BC Managed Care – PPO | Source: Ambulatory Visit | Attending: Obstetrics and Gynecology | Admitting: Obstetrics and Gynecology

## 2015-09-17 VITALS — BP 106/68 | HR 78 | Temp 98.2°F | Wt 188.3 lb

## 2015-09-17 DIAGNOSIS — O98312 Other infections with a predominantly sexual mode of transmission complicating pregnancy, second trimester: Secondary | ICD-10-CM

## 2015-09-17 DIAGNOSIS — Z3482 Encounter for supervision of other normal pregnancy, second trimester: Secondary | ICD-10-CM

## 2015-09-17 DIAGNOSIS — Z3A25 25 weeks gestation of pregnancy: Secondary | ICD-10-CM | POA: Diagnosis not present

## 2015-09-17 DIAGNOSIS — A6009 Herpesviral infection of other urogenital tract: Secondary | ICD-10-CM

## 2015-09-17 DIAGNOSIS — Z36 Encounter for antenatal screening of mother: Secondary | ICD-10-CM | POA: Diagnosis not present

## 2015-09-17 DIAGNOSIS — O4442 Low lying placenta NOS or without hemorrhage, second trimester: Secondary | ICD-10-CM

## 2015-09-17 DIAGNOSIS — O4440 Low lying placenta NOS or without hemorrhage, unspecified trimester: Secondary | ICD-10-CM | POA: Insufficient documentation

## 2015-09-17 LAB — POCT URINALYSIS DIP (DEVICE)
BILIRUBIN URINE: NEGATIVE
Glucose, UA: NEGATIVE mg/dL
HGB URINE DIPSTICK: NEGATIVE
Ketones, ur: NEGATIVE mg/dL
Nitrite: NEGATIVE
PH: 7 (ref 5.0–8.0)
Protein, ur: NEGATIVE mg/dL
SPECIFIC GRAVITY, URINE: 1.02 (ref 1.005–1.030)
Urobilinogen, UA: 0.2 mg/dL (ref 0.0–1.0)

## 2015-09-17 NOTE — Progress Notes (Signed)
Subjective:  Dorcas CarrowJacqueline E Holthaus is a 30 y.o. G3P1011 at 7778w6d being seen today for ongoing prenatal care.  She is currently monitored for the following issues for this low-risk pregnancy and has Supervision of normal subsequent pregnancy; Hx of herpes genitalis; and Low-lying placenta in second trimester on her problem list.  Patient reports no complaints.  Contractions: Not present.  .  Movement: Present. Denies leaking of fluid.   The following portions of the patient's history were reviewed and updated as appropriate: allergies, current medications, past family history, past medical history, past social history, past surgical history and problem list. Problem list updated.  Objective:   Filed Vitals:   09/17/15 0923  BP: 106/68  Pulse: 78  Temp: 98.2 F (36.8 C)  Weight: 188 lb 4.8 oz (85.412 kg)    Fetal Status: Fetal Heart Rate (bpm): 145 Fundal Height: 25 cm Movement: Present     General:  Alert, oriented and cooperative. Patient is in no acute distress.  Skin: Skin is warm and dry. No rash noted.   Cardiovascular: Normal heart rate noted  Respiratory: Normal respiratory effort, no problems with respiration noted  Abdomen: Soft, gravid, appropriate for gestational age. Pain/Pressure: Absent     Pelvic:       Cervical exam deferred        Extremities: Normal range of motion.  Edema: None  Mental Status: Normal mood and affect. Normal behavior. Normal judgment and thought content.   Urinalysis: Urine Protein: Negative Urine Glucose: Negative  Assessment and Plan:  Pregnancy: G3P1011 at 6978w6d  1. Low-lying placenta in second trimester - Ultrasound today showed resolved   2. Encounter for supervision of other normal pregnancy in second trimester - Discussed third trimester labs at next visit  Preterm labor symptoms and general obstetric precautions including but not limited to vaginal bleeding, contractions, leaking of fluid and fetal movement were reviewed in detail with  the patient. Please refer to After Visit Summary for other counseling recommendations.  Return in about 4 weeks (around 10/15/2015).   Eino FarberWalidah Kennith GainN Karim, CNM

## 2015-10-14 ENCOUNTER — Encounter: Payer: BC Managed Care – PPO | Admitting: Obstetrics and Gynecology

## 2015-10-15 ENCOUNTER — Ambulatory Visit (INDEPENDENT_AMBULATORY_CARE_PROVIDER_SITE_OTHER): Payer: BC Managed Care – PPO | Admitting: Advanced Practice Midwife

## 2015-10-15 VITALS — BP 112/76 | HR 89 | Wt 189.1 lb

## 2015-10-15 DIAGNOSIS — N949 Unspecified condition associated with female genital organs and menstrual cycle: Secondary | ICD-10-CM

## 2015-10-15 DIAGNOSIS — Z3482 Encounter for supervision of other normal pregnancy, second trimester: Secondary | ICD-10-CM

## 2015-10-15 DIAGNOSIS — O26893 Other specified pregnancy related conditions, third trimester: Secondary | ICD-10-CM

## 2015-10-15 DIAGNOSIS — Z23 Encounter for immunization: Secondary | ICD-10-CM

## 2015-10-15 LAB — CBC
HEMATOCRIT: 31.8 % — AB (ref 35.0–45.0)
Hemoglobin: 10.9 g/dL — ABNORMAL LOW (ref 11.7–15.5)
MCH: 31.1 pg (ref 27.0–33.0)
MCHC: 34.3 g/dL (ref 32.0–36.0)
MCV: 90.9 fL (ref 80.0–100.0)
MPV: 9.7 fL (ref 7.5–12.5)
PLATELETS: 157 10*3/uL (ref 140–400)
RBC: 3.5 MIL/uL — ABNORMAL LOW (ref 3.80–5.10)
RDW: 13.1 % (ref 11.0–15.0)
WBC: 10.6 10*3/uL (ref 3.8–10.8)

## 2015-10-15 LAB — POCT URINALYSIS DIP (DEVICE)
Bilirubin Urine: NEGATIVE
Glucose, UA: NEGATIVE mg/dL
HGB URINE DIPSTICK: NEGATIVE
Ketones, ur: NEGATIVE mg/dL
Leukocytes, UA: NEGATIVE
NITRITE: NEGATIVE
PH: 7 (ref 5.0–8.0)
PROTEIN: NEGATIVE mg/dL
Specific Gravity, Urine: 1.015 (ref 1.005–1.030)
UROBILINOGEN UA: 1 mg/dL (ref 0.0–1.0)

## 2015-10-15 NOTE — Progress Notes (Signed)
Subjective:  Claudia Graves is a 30 y.o. G3P1011 at 7274w6d being seen today for ongoing prenatal care.  She is currently monitored for the following issues for this low-risk pregnancy and has Supervision of normal subsequent pregnancy; Hx of herpes genitalis; and Low-lying placenta in second trimester on her problem list.  Patient reports intermittent pelvic pressure, denies pain with this.  Contractions: Not present.  .  Movement: Present. Denies leaking of fluid.   The following portions of the patient's history were reviewed and updated as appropriate: allergies, current medications, past family history, past medical history, past social history, past surgical history and problem list. Problem list updated.  Objective:   Filed Vitals:   10/15/15 0821  BP: 112/76  Pulse: 89  Weight: 189 lb 1.6 oz (85.775 kg)    Fetal Status: Fetal Heart Rate (bpm): 135   Movement: Present     General:  Alert, oriented and cooperative. Patient is in no acute distress.  Skin: Skin is warm and dry. No rash noted.   Cardiovascular: Normal heart rate noted  Respiratory: Normal respiratory effort, no problems with respiration noted  Abdomen: Soft, gravid, appropriate for gestational age. Pain/Pressure: Present     Pelvic:       Cervical exam deferred        Extremities: Normal range of motion.  Edema: None  Mental Status: Normal mood and affect. Normal behavior. Normal judgment and thought content.   Urinalysis:      Assessment and Plan:  Pregnancy: G3P1011 at 5974w6d  1. Need for Tdap vaccination  - Tdap vaccine greater than or equal to 7yo IM  2. Need for influenza vaccination  - Flu Vaccine QUAD 36+ mos IM (Fluarix, Quad PF)  3. Pelvic pressure in pregnancy, antepartum, third trimester --U/A collected and urine culture sent  4. Encounter for supervision of other normal pregnancy in second trimester --28 week labs/GTS done today  Preterm labor symptoms and general obstetric precautions  including but not limited to vaginal bleeding, contractions, leaking of fluid and fetal movement were reviewed in detail with the patient. Please refer to After Visit Summary for other counseling recommendations.  Return in about 2 weeks (around 10/29/2015).   Hurshel PartyLisa A Leftwich-Kirby, CNM

## 2015-10-16 LAB — RPR

## 2015-10-16 LAB — GLUCOSE TOLERANCE, 1 HOUR (50G) W/O FASTING: GLUCOSE, 1 HR, GESTATIONAL: 86 mg/dL (ref <140)

## 2015-10-16 LAB — HIV ANTIBODY (ROUTINE TESTING W REFLEX): HIV: NONREACTIVE

## 2015-10-23 ENCOUNTER — Encounter: Payer: Self-pay | Admitting: Family Medicine

## 2015-10-30 ENCOUNTER — Ambulatory Visit (INDEPENDENT_AMBULATORY_CARE_PROVIDER_SITE_OTHER): Payer: BC Managed Care – PPO | Admitting: Advanced Practice Midwife

## 2015-10-30 VITALS — BP 107/76 | HR 95 | Temp 98.3°F | Wt 192.4 lb

## 2015-10-30 DIAGNOSIS — O26893 Other specified pregnancy related conditions, third trimester: Secondary | ICD-10-CM | POA: Diagnosis not present

## 2015-10-30 DIAGNOSIS — N949 Unspecified condition associated with female genital organs and menstrual cycle: Secondary | ICD-10-CM

## 2015-10-30 LAB — POCT URINALYSIS DIP (DEVICE)
Bilirubin Urine: NEGATIVE
Glucose, UA: NEGATIVE mg/dL
HGB URINE DIPSTICK: NEGATIVE
Ketones, ur: NEGATIVE mg/dL
NITRITE: NEGATIVE
PH: 7 (ref 5.0–8.0)
Protein, ur: NEGATIVE mg/dL
SPECIFIC GRAVITY, URINE: 1.01 (ref 1.005–1.030)
UROBILINOGEN UA: 1 mg/dL (ref 0.0–1.0)

## 2015-10-30 NOTE — Patient Instructions (Signed)

## 2015-10-30 NOTE — Progress Notes (Signed)
Subjective:  Claudia Graves is a 30 y.o. G3P1011 at 1972w0d being seen today for ongoing prenatal care.  She is currently monitored for the following issues for this low-risk pregnancy and has Supervision of normal subsequent pregnancy; Hx of herpes genitalis; and Low-lying placenta in second trimester on her problem list.  Patient reports Some pressure at times, no contractions or pain.  Contractions: Not present. Vag. Bleeding: None.  Movement: Present. Denies leaking of fluid.   The following portions of the patient's history were reviewed and updated as appropriate: allergies, current medications, past family history, past medical history, past social history, past surgical history and problem list. Problem list updated.  Objective:   Filed Vitals:   10/30/15 1526  BP: 107/76  Pulse: 95  Temp: 98.3 F (36.8 C)  Weight: 192 lb 6.4 oz (87.272 kg)    Fetal Status:     Movement: Present     General:  Alert, oriented and cooperative. Patient is in no acute distress.  Skin: Skin is warm and dry. No rash noted.   Cardiovascular: Normal heart rate noted  Respiratory: Normal respiratory effort, no problems with respiration noted  Abdomen: Soft, gravid, appropriate for gestational age. Pain/Pressure: Present     Pelvic: Vag. Bleeding: None     Cervical exam deferred        Extremities: Normal range of motion.  Edema: None  Mental Status: Normal mood and affect. Normal behavior. Normal judgment and thought content.   Urinalysis: Urine Protein: Negative Urine Glucose: Negative  Assessment and Plan:  Pregnancy: G3P1011 at 7572w0d  1. Pelvic pressure in pregnancy, antepartum, third trimester     Discussed pressure can be normal     Discussed what contractions feel like.  Come in if painful contractions more than 5-6/hour  Preterm labor symptoms and general obstetric precautions including but not limited to vaginal bleeding, contractions, leaking of fluid and fetal movement were reviewed  in detail with the patient. Please refer to After Visit Summary for other counseling recommendations.  Return in about 2 weeks (around 11/13/2015) for [facility].   Aviva SignsMarie L Wendy Mikles, CNM

## 2015-11-12 ENCOUNTER — Ambulatory Visit (INDEPENDENT_AMBULATORY_CARE_PROVIDER_SITE_OTHER): Payer: BC Managed Care – PPO | Admitting: Certified Nurse Midwife

## 2015-11-12 VITALS — BP 107/65 | HR 88 | Wt 192.0 lb

## 2015-11-12 DIAGNOSIS — Z8619 Personal history of other infectious and parasitic diseases: Secondary | ICD-10-CM

## 2015-11-12 DIAGNOSIS — O4442 Low lying placenta NOS or without hemorrhage, second trimester: Secondary | ICD-10-CM

## 2015-11-12 DIAGNOSIS — Z3483 Encounter for supervision of other normal pregnancy, third trimester: Secondary | ICD-10-CM

## 2015-11-12 LAB — POCT URINALYSIS DIP (DEVICE)
Bilirubin Urine: NEGATIVE
Glucose, UA: NEGATIVE mg/dL
Hgb urine dipstick: NEGATIVE
Ketones, ur: NEGATIVE mg/dL
Nitrite: NEGATIVE
Protein, ur: NEGATIVE mg/dL
Specific Gravity, Urine: 1.015 (ref 1.005–1.030)
Urobilinogen, UA: 1 mg/dL (ref 0.0–1.0)
pH: 7 (ref 5.0–8.0)

## 2015-11-12 NOTE — Progress Notes (Signed)
Subjective:  Dorcas CarrowJacqueline E Scoville is a 30 y.o. G3P1011 at 6851w6d being seen today for ongoing prenatal care.  She is currently monitored for the following issues for this low-risk pregnancy and has Supervision of normal subsequent pregnancy; Hx of herpes genitalis; and Low-lying placenta in second trimester on her problem list.  Patient reports no complaints.  Contractions: Not present.  .  Movement: Present. Denies leaking of fluid.   The following portions of the patient's history were reviewed and updated as appropriate: allergies, current medications, past family history, past medical history, past social history, past surgical history and problem list. Problem list updated.  Objective:   Filed Vitals:   11/12/15 0759  BP: 107/65  Pulse: 88  Weight: 192 lb (87.091 kg)    Fetal Status: Fetal Heart Rate (bpm): 135 Fundal Height: 32 cm Movement: Present     General:  Alert, oriented and cooperative. Patient is in no acute distress.  Skin: Skin is warm and dry. No rash noted.   Cardiovascular: Normal heart rate noted  Respiratory: Normal respiratory effort, no problems with respiration noted  Abdomen: Soft, gravid, appropriate for gestational age. Pain/Pressure: Present     Pelvic:       Cervical exam deferred        Extremities: Normal range of motion.     Mental Status: Normal mood and affect. Normal behavior. Normal judgment and thought content.   Urinalysis:      Assessment and Plan:  Pregnancy: G3P1011 at 7451w6d  1. Hx of herpes genitalis   2. Low-lying placenta in second trimester   3. Encounter for supervision of other normal pregnancy in third trimester   Preterm labor symptoms and general obstetric precautions including but not limited to vaginal bleeding, contractions, leaking of fluid and fetal movement were reviewed in detail with the patient. Please refer to After Visit Summary for other counseling recommendations.  Return in about 2 weeks (around  11/26/2015).   Rhea PinkLori A Quan Cybulski, CNM

## 2015-11-12 NOTE — Patient Instructions (Signed)

## 2015-12-03 ENCOUNTER — Ambulatory Visit (INDEPENDENT_AMBULATORY_CARE_PROVIDER_SITE_OTHER): Payer: BC Managed Care – PPO | Admitting: Family

## 2015-12-03 VITALS — BP 113/81 | HR 88 | Wt 195.2 lb

## 2015-12-03 DIAGNOSIS — Z113 Encounter for screening for infections with a predominantly sexual mode of transmission: Secondary | ICD-10-CM

## 2015-12-03 DIAGNOSIS — O98313 Other infections with a predominantly sexual mode of transmission complicating pregnancy, third trimester: Secondary | ICD-10-CM

## 2015-12-03 DIAGNOSIS — A6000 Herpesviral infection of urogenital system, unspecified: Secondary | ICD-10-CM

## 2015-12-03 DIAGNOSIS — Z3483 Encounter for supervision of other normal pregnancy, third trimester: Secondary | ICD-10-CM

## 2015-12-03 DIAGNOSIS — Z8619 Personal history of other infectious and parasitic diseases: Secondary | ICD-10-CM

## 2015-12-03 LAB — POCT URINALYSIS DIP (DEVICE)
Bilirubin Urine: NEGATIVE
Glucose, UA: NEGATIVE mg/dL
Hgb urine dipstick: NEGATIVE
Ketones, ur: NEGATIVE mg/dL
Leukocytes, UA: NEGATIVE
Nitrite: NEGATIVE
Protein, ur: NEGATIVE mg/dL
Specific Gravity, Urine: 1.015 (ref 1.005–1.030)
Urobilinogen, UA: 1 mg/dL (ref 0.0–1.0)
pH: 7 (ref 5.0–8.0)

## 2015-12-03 MED ORDER — VALACYCLOVIR HCL 1 G PO TABS: 1000.0000 mg | ORAL_TABLET | Freq: Every day | ORAL | Status: DC

## 2015-12-03 MED ORDER — PRENATAL VITAMINS 0.8 MG PO TABS: 1.0000 | ORAL_TABLET | Freq: Every day | ORAL | Status: DC

## 2015-12-03 NOTE — Progress Notes (Signed)
Breastfeeding tip reviewed Cultures today 

## 2015-12-03 NOTE — Progress Notes (Signed)
Subjective:  Claudia Graves is a 30 y.o. G3P1011 at 6815w6d being seen today for ongoing prenatal care.  She is currently monitored for the following issues for this low-risk pregnancy and has Supervision of normal subsequent pregnancy; Hx of herpes genitalis; and Low-lying placenta in second trimester on her problem list.  Patient reports no complaints.  Contractions: Not present. Vag. Bleeding: None.  Movement: Present. Denies leaking of fluid.   The following portions of the patient's history were reviewed and updated as appropriate: allergies, current medications, past family history, past medical history, past social history, past surgical history and problem list. Problem list updated.  Objective:   Filed Vitals:   12/03/15 1001  BP: 113/81  Pulse: 88  Weight: 195 lb 3.2 oz (88.542 kg)    Fetal Status: Fetal Heart Rate (bpm): 150 Fundal Height: 36 cm Movement: Present  Presentation: Vertex  General:  Alert, oriented and cooperative. Patient is in no acute distress.  Skin: Skin is warm and dry. No rash noted.   Cardiovascular: Normal heart rate noted  Respiratory: Normal respiratory effort, no problems with respiration noted  Abdomen: Soft, gravid, appropriate for gestational age. Pain/Pressure: Present     Pelvic: Cervical exam performed Dilation: Fingertip Effacement (%): Thick    Extremities: Normal range of motion.  Edema: Trace  Mental Status: Normal mood and affect. Normal behavior. Normal judgment and thought content.   Urinalysis:    Protein neg  Glucose neg  Assessment and Plan:  Pregnancy: G3P1011 at 5815w6d  1. Encounter for supervision of other normal pregnancy in third trimester - GC[facility]Chlam[facility] amp [facility] - Culture, beta strep (group b only)  2. Herpes genitalia - valACYclovir (VALTREX) 1000 MG tablet; Take 1 tablet (1,000 mg total) by mouth daily.  Dispense: 30 tablet; Refill: 1  Preterm labor symptoms and general obstetric precautions  including but not limited to vaginal bleeding, contractions, leaking of fluid and fetal movement were reviewed in detail with the patient. Please refer to After Visit Summary for other counseling recommendations.  Return in about 1 week (around 12/10/2015) for LOB.   Eino FarberWalidah Kennith GainN Karim, CNM

## 2015-12-03 NOTE — Patient Instructions (Signed)
Fetal Movement Counts Patient Name: __________________________________________________ Patient Due Date: ____________________ Performing a fetal movement count is highly recommended in high-risk pregnancies, but it is good for every pregnant woman to do. Your health care provider may ask you to start counting fetal movements at 28 weeks of the pregnancy. Fetal movements often increase:  After eating a full meal.  After physical activity.  After eating or drinking something sweet or cold.  At rest. Pay attention to when you feel the baby is most active. This will help you notice a pattern of your baby's sleep and wake cycles and what factors contribute to an increase in fetal movement. It is important to perform a fetal movement count at the same time each day when your baby is normally most active.  HOW TO COUNT FETAL MOVEMENTS 1. Find a quiet and comfortable area to sit or lie down on your left side. Lying on your left side provides the best blood and oxygen circulation to your baby. 2. Write down the day and time on a sheet of paper or in a journal. 3. Start counting kicks, flutters, swishes, rolls, or jabs in a 2-hour period. You should feel at least 10 movements within 2 hours. 4. If you do not feel 10 movements in 2 hours, wait 2-3 hours and count again. Look for a change in the pattern or not enough counts in 2 hours. SEEK MEDICAL CARE IF:  You feel less than 10 counts in 2 hours, tried twice.  There is no movement in over an hour.  The pattern is changing or taking longer each day to reach 10 counts in 2 hours.  You feel the baby is not moving as he or she usually does. Date: ____________ Movements: ____________ Start time: ____________ Finish time: ____________  Date: ____________ Movements: ____________ Start time: ____________ Finish time: ____________ Date: ____________ Movements: ____________ Start time: ____________ Finish time: ____________ Date: ____________ Movements:  ____________ Start time: ____________ Finish time: ____________ Date: ____________ Movements: ____________ Start time: ____________ Finish time: ____________ Date: ____________ Movements: ____________ Start time: ____________ Finish time: ____________ Date: ____________ Movements: ____________ Start time: ____________ Finish time: ____________ Date: ____________ Movements: ____________ Start time: ____________ Finish time: ____________  Date: ____________ Movements: ____________ Start time: ____________ Finish time: ____________ Date: ____________ Movements: ____________ Start time: ____________ Finish time: ____________ Date: ____________ Movements: ____________ Start time: ____________ Finish time: ____________ Date: ____________ Movements: ____________ Start time: ____________ Finish time: ____________ Date: ____________ Movements: ____________ Start time: ____________ Finish time: ____________ Date: ____________ Movements: ____________ Start time: ____________ Finish time: ____________ Date: ____________ Movements: ____________ Start time: ____________ Finish time: ____________  Date: ____________ Movements: ____________ Start time: ____________ Finish time: ____________ Date: ____________ Movements: ____________ Start time: ____________ Finish time: ____________ Date: ____________ Movements: ____________ Start time: ____________ Finish time: ____________ Date: ____________ Movements: ____________ Start time: ____________ Finish time: ____________ Date: ____________ Movements: ____________ Start time: ____________ Finish time: ____________ Date: ____________ Movements: ____________ Start time: ____________ Finish time: ____________ Date: ____________ Movements: ____________ Start time: ____________ Finish time: ____________  Date: ____________ Movements: ____________ Start time: ____________ Finish time: ____________ Date: ____________ Movements: ____________ Start time: ____________ Finish  time: ____________ Date: ____________ Movements: ____________ Start time: ____________ Finish time: ____________ Date: ____________ Movements: ____________ Start time: ____________ Finish time: ____________ Date: ____________ Movements: ____________ Start time: ____________ Finish time: ____________ Date: ____________ Movements: ____________ Start time: ____________ Finish time: ____________ Date: ____________ Movements: ____________ Start time: ____________ Finish time: ____________  Date: ____________ Movements: ____________ Start time: ____________ Finish   time: ____________ Date: ____________ Movements: ____________ Start time: ____________ Finish time: ____________ Date: ____________ Movements: ____________ Start time: ____________ Finish time: ____________ Date: ____________ Movements: ____________ Start time: ____________ Finish time: ____________ Date: ____________ Movements: ____________ Start time: ____________ Finish time: ____________ Date: ____________ Movements: ____________ Start time: ____________ Finish time: ____________ Date: ____________ Movements: ____________ Start time: ____________ Finish time: ____________  Date: ____________ Movements: ____________ Start time: ____________ Finish time: ____________ Date: ____________ Movements: ____________ Start time: ____________ Finish time: ____________ Date: ____________ Movements: ____________ Start time: ____________ Finish time: ____________ Date: ____________ Movements: ____________ Start time: ____________ Finish time: ____________ Date: ____________ Movements: ____________ Start time: ____________ Finish time: ____________ Date: ____________ Movements: ____________ Start time: ____________ Finish time: ____________ Date: ____________ Movements: ____________ Start time: ____________ Finish time: ____________  Date: ____________ Movements: ____________ Start time: ____________ Finish time: ____________ Date: ____________  Movements: ____________ Start time: ____________ Finish time: ____________ Date: ____________ Movements: ____________ Start time: ____________ Finish time: ____________ Date: ____________ Movements: ____________ Start time: ____________ Finish time: ____________ Date: ____________ Movements: ____________ Start time: ____________ Finish time: ____________ Date: ____________ Movements: ____________ Start time: ____________ Finish time: ____________ Date: ____________ Movements: ____________ Start time: ____________ Finish time: ____________  Date: ____________ Movements: ____________ Start time: ____________ Finish time: ____________ Date: ____________ Movements: ____________ Start time: ____________ Finish time: ____________ Date: ____________ Movements: ____________ Start time: ____________ Finish time: ____________ Date: ____________ Movements: ____________ Start time: ____________ Finish time: ____________ Date: ____________ Movements: ____________ Start time: ____________ Finish time: ____________ Date: ____________ Movements: ____________ Start time: ____________ Finish time: ____________   This information is not intended to replace advice given to you by your health care provider. Make sure you discuss any questions you have with your health care provider.   Document Released: 07/07/2006 Document Revised: 06/28/2014 Document Reviewed: 04/03/2012 Elsevier Interactive Patient Education 2016 [employer] Braxton Hicks Contractions Contractions of the uterus can occur throughout pregnancy. Contractions are not always a sign that you are in labor.  WHAT ARE BRAXTON HICKS CONTRACTIONS?  Contractions that occur before labor are called Braxton Hicks contractions, or false labor. Toward the end of pregnancy (32-34 weeks), these contractions can develop more often and may become more forceful. This is not true labor because these contractions do not result in opening (dilatation) and thinning of  the cervix. They are sometimes difficult to tell apart from true labor because these contractions can be forceful and people have different pain tolerances. You should not feel embarrassed if you go to the hospital with false labor. Sometimes, the only way to tell if you are in true labor is for your health care provider to look for changes in the cervix. If there are no prenatal problems or other health problems associated with the pregnancy, it is completely safe to be sent home with false labor and await the onset of true labor. HOW CAN YOU TELL THE DIFFERENCE BETWEEN TRUE AND FALSE LABOR? False Labor  The contractions of false labor are usually shorter and not as hard as those of true labor.   The contractions are usually irregular.   The contractions are often felt in the front of the lower abdomen and in the groin.   The contractions may go away when you walk around or change positions while lying down.   The contractions get weaker and are shorter lasting as time goes on.   The contractions do not usually become progressively stronger, regular, and closer together as with true labor.  True Labor 5. Contractions in true   labor last 30-70 seconds, become very regular, usually become more intense, and increase in frequency.  6. The contractions do not go away with walking.  7. The discomfort is usually felt in the top of the uterus and spreads to the lower abdomen and low back.  8. True labor can be determined by your health care provider with an exam. This will show that the cervix is dilating and getting thinner.  WHAT TO REMEMBER  Keep up with your usual exercises and follow other instructions given by your health care provider.   Take medicines as directed by your health care provider.   Keep your regular prenatal appointments.   Eat and drink lightly if you think you are going into labor.   If Braxton Hicks contractions are making you uncomfortable:   Change  your position from lying down or resting to walking, or from walking to resting.   Sit and rest in a tub of warm water.   Drink 2-3 glasses of water. Dehydration may cause these contractions.   Do slow and deep breathing several times an hour.  WHEN SHOULD I SEEK IMMEDIATE MEDICAL CARE? Seek immediate medical care if:  Your contractions become stronger, more regular, and closer together.   You have fluid leaking or gushing from your vagina.   You have a fever.   You pass blood-tinged mucus.   You have vaginal bleeding.   You have continuous abdominal pain.   You have low back pain that you never had before.   You feel your baby's head pushing down and causing pelvic pressure.   Your baby is not moving as much as it used to.    This information is not intended to replace advice given to you by your health care provider. Make sure you discuss any questions you have with your health care provider.   Document Released: 06/07/2005 Document Revised: 06/12/2013 Document Reviewed: 03/19/2013 Elsevier Interactive Patient Education 2016 Elsevier Inc.  

## 2015-12-04 LAB — GC/CHLAMYDIA PROBE AMP (~~LOC~~) NOT AT ARMC
Chlamydia: NEGATIVE
NEISSERIA GONORRHEA: NEGATIVE

## 2015-12-05 LAB — CULTURE, BETA STREP (GROUP B ONLY)

## 2015-12-10 ENCOUNTER — Ambulatory Visit (INDEPENDENT_AMBULATORY_CARE_PROVIDER_SITE_OTHER): Payer: BC Managed Care – PPO | Admitting: Family

## 2015-12-10 VITALS — BP 117/68 | HR 77 | Wt 195.3 lb

## 2015-12-10 DIAGNOSIS — Z3483 Encounter for supervision of other normal pregnancy, third trimester: Secondary | ICD-10-CM

## 2015-12-10 DIAGNOSIS — Z3493 Encounter for supervision of normal pregnancy, unspecified, third trimester: Secondary | ICD-10-CM

## 2015-12-10 DIAGNOSIS — Z8619 Personal history of other infectious and parasitic diseases: Secondary | ICD-10-CM

## 2015-12-10 LAB — POCT URINALYSIS DIP (DEVICE)
Bilirubin Urine: NEGATIVE
GLUCOSE, UA: NEGATIVE mg/dL
HGB URINE DIPSTICK: NEGATIVE
Ketones, ur: NEGATIVE mg/dL
NITRITE: NEGATIVE
PROTEIN: NEGATIVE mg/dL
SPECIFIC GRAVITY, URINE: 1.015 (ref 1.005–1.030)
UROBILINOGEN UA: 0.2 mg/dL (ref 0.0–1.0)
pH: 7 (ref 5.0–8.0)

## 2015-12-10 MED ORDER — PRENATAL MULTIVITAMIN CH: 1.0000 | ORAL_TABLET | Freq: Every day | ORAL | Status: AC

## 2015-12-10 NOTE — Progress Notes (Signed)
Subjective:  Claudia Graves is a 30 y.o. G3P1011 at 5026w6d being seen today for ongoing prenatal care.  She is currently monitored for the following issues for this low-risk pregnancy and has Supervision of normal subsequent pregnancy; Hx of herpes genitalis; and Low-lying placenta in second trimester on her problem list.  Patient reports no complaints.  Contractions: Not present. Vag. Bleeding: None.  Movement: Present. Denies leaking of fluid.   The following portions of the patient's history were reviewed and updated as appropriate: allergies, current medications, past family history, past medical history, past social history, past surgical history and problem list. Problem list updated.  Objective:   Filed Vitals:   12/10/15 0806  BP: 117/68  Pulse: 77  Weight: 195 lb 4.8 oz (88.587 kg)    Fetal Status: Fetal Heart Rate (bpm): 141 Fundal Height: 37 cm Movement: Present  Presentation: Vertex  General:  Alert, oriented and cooperative. Patient is in no acute distress.  Skin: Skin is warm and dry. No rash noted.   Cardiovascular: Normal heart rate noted  Respiratory: Normal respiratory effort, no problems with respiration noted  Abdomen: Soft, gravid, appropriate for gestational age. Pain/Pressure: Present     Pelvic: Cervical exam deferred        Extremities: Normal range of motion.  Edema: Trace  Mental Status: Normal mood and affect. Normal behavior. Normal judgment and thought content.   Urinalysis: Urine Protein: Negative Urine Glucose: Negative  Assessment and Plan:  Pregnancy: G3P1011 at 3126w6d  1. Prenatal care in third trimester - Reviewed pos GBS results and need for PCN IV antibiotics  2.  Hx of herpes genitalis Continue Valtrex  Term labor symptoms and general obstetric precautions including but not limited to vaginal bleeding, contractions, leaking of fluid and fetal movement were reviewed in detail with the patient. Please refer to After Visit Summary for other  counseling recommendations.  Return in about 1 week (around 12/17/2015).   Eino FarberWalidah Kennith GainN Karim, CNM

## 2015-12-12 LAB — OB RESULTS CONSOLE GBS: STREP GROUP B AG: POSITIVE

## 2015-12-17 ENCOUNTER — Ambulatory Visit (INDEPENDENT_AMBULATORY_CARE_PROVIDER_SITE_OTHER): Payer: BC Managed Care – PPO | Admitting: Obstetrics & Gynecology

## 2015-12-17 VITALS — BP 110/78 | HR 80 | Wt 198.1 lb

## 2015-12-17 DIAGNOSIS — Z8619 Personal history of other infectious and parasitic diseases: Secondary | ICD-10-CM

## 2015-12-17 DIAGNOSIS — Z3483 Encounter for supervision of other normal pregnancy, third trimester: Secondary | ICD-10-CM

## 2015-12-17 DIAGNOSIS — O99713 Diseases of the skin and subcutaneous tissue complicating pregnancy, third trimester: Secondary | ICD-10-CM

## 2015-12-17 DIAGNOSIS — L299 Pruritus, unspecified: Secondary | ICD-10-CM

## 2015-12-17 DIAGNOSIS — O26893 Other specified pregnancy related conditions, third trimester: Secondary | ICD-10-CM

## 2015-12-17 LAB — POCT URINALYSIS DIP (DEVICE)
Glucose, UA: NEGATIVE mg/dL
Ketones, ur: 40 mg/dL — AB
NITRITE: NEGATIVE
Protein, ur: 30 mg/dL — AB
SPECIFIC GRAVITY, URINE: 1.02 (ref 1.005–1.030)
Urobilinogen, UA: 1 mg/dL (ref 0.0–1.0)
pH: 7 (ref 5.0–8.0)

## 2015-12-17 MED ORDER — HYDROXYZINE HCL 25 MG PO TABS: 50.0000 mg | ORAL_TABLET | Freq: Four times a day (QID) | ORAL | Status: DC | PRN

## 2015-12-17 NOTE — Progress Notes (Signed)
Subjective:  Claudia Graves is a 30 y.o. G3P1011 at 75w6dbeing seen today for ongoing prenatal care.  She is currently monitored for the following issues for this low-risk pregnancy and has Supervision of normal subsequent pregnancy and Hx of herpes genitalis on her problem list.  Patient reports no complaints.  Contractions: Not present. Vag. Bleeding: None.  Movement: Present. Denies leaking of fluid.   The following portions of the patient's history were reviewed and updated as appropriate: allergies, current medications, past family history, past medical history, past social history, past surgical history and problem list. Problem list updated.  Objective:   Filed Vitals:   12/17/15 1613  BP: 110/78  Pulse: 80  Weight: 198 lb 1.6 oz (89.858 kg)    Fetal Status: Fetal Heart Rate (bpm): 142 Fundal Height: 38 cm Movement: Present     General:  Alert, oriented and cooperative. Patient is in no acute distress.  Skin: Skin is warm and dry. No rash noted.   Cardiovascular: Normal heart rate noted  Respiratory: Normal respiratory effort, no problems with respiration noted  Abdomen: Soft, gravid, appropriate for gestational age. Pain/Pressure: Present     Pelvic: Cervical exam deferred        Extremities: Normal range of motion.  Edema: Trace  Mental Status: Normal mood and affect. Normal behavior. Normal judgment and thought content.   Urinalysis: Urine Protein: 1+ Urine Glucose: Negative  Assessment and Plan:  Pregnancy: G3P1011 at 365w6d1. Pruritus of pregnancy in third trimester Concerned about ICP.  Atarax prescribed for itching.  If she has ICP, will move to delivery. - Bile acids, total - Comp Met (CMET) - hydrOXYzine (ATARAX/VISTARIL) 25 MG tablet; Take 2 tablets (50 mg total) by mouth every 6 (six) hours as needed for itching.  Dispense: 30 tablet; Refill: 2  2. Hx of herpes genitalis Continue Valtrex suppression  3. Encounter for supervision of other normal  pregnancy in third trimester Term labor symptoms and general obstetric precautions including but not limited to vaginal bleeding, contractions, leaking of fluid and fetal movement were reviewed in detail with the patient. Please refer to After Visit Summary for other counseling recommendations.  Return in about 1 week (around 12/24/2015) for OB Visit.   UgOsborne OmanMD

## 2015-12-17 NOTE — Patient Instructions (Signed)
Cholestasis of Pregnancy °Cholestasis refers to any condition that causes the flow of the digestive fluid (bile) produced by your liver to slow or stop. Cholestasis of pregnancy is most common toward the end of pregnancy (third trimester), but it can occur any time during your pregnancy. The condition often goes away soon after your baby is born.  °Cholestasis may be uncomfortable but is usually harmless to you. However, it can be harmful to your baby. Cholestasis may increase the risk that your baby will be born too early (preterm delivery).  °CAUSES  °The cause of cholestasis of pregnancy is not known. Pregnancy hormones may affect the way your gallbladder functions. Your gallbladder normally holds the bile from your liver until you need it to help digest fat in your diet. Pregnancy hormones may cause the flow of bile to slow down and back up into your liver. Bile may then get into your bloodstream and cause cholestasis symptoms. °RISK FACTORS °You may be at increased risk if: °· You had cholestasis during a previous pregnancy. °· You have a family history of cholestasis. °· You have liver problems. °· You are having twins. °SIGNS AND SYMPTOMS  °The most common symptom of cholestasis of pregnancy is intense itching, especially on the palms of your hands and soles of your feet. The itching can spread to the rest of your body and is often worse at night. You will not usually have a rash. Other symptoms may include:  °· Feeling tired.   °· Yellowish discoloration of your skin and the whites of your eyes (jaundice).   °· Dark-colored urine.   °· Light-colored stools. °· Poor appetite.    °DIAGNOSIS  °Your health care provider will take your medical history and do a physical exam. You may have blood tests to check your liver function, bile level, and bilirubin level.  °TREATMENT  °Treatment is meant to make you more comfortable and keep your baby safe. Your health care provider may prescribe medicine to relieve your  itching. The medicine used may also improve your blood test results and help keep your baby safe. Your health care provider may also give you vitamin K before delivery to prevent excessive bleeding.  °Your health care provider may want to check your baby (fetal monitoring) frequently, as often as every 2 weeks. Once your baby's lungs have developed enough, your health care provider may recommend starting (inducing) your labor and delivery by week 37 of your pregnancy. °HOME CARE INSTRUCTIONS  °· Only use anti-itch creams and take medicines as directed by your health care provider. °· Take cool baths to soothe your itching.   °· Keep your fingernails short to prevent skin irritation from scratching.   °· Keep all your appointments for fetal monitoring. °SEEK MEDICAL CARE IF:  °Your symptoms get worse, even with treatment. °SEEK IMMEDIATE MEDICAL CARE IF: °You go into early labor at home. °MAKE SURE YOU: °· Understand these instructions. °· Will watch your condition. °· Will get help right away if you are not doing well or get worse. °  °This information is not intended to replace advice given to you by your health care provider. Make sure you discuss any questions you have with your health care provider. °  °Document Released: 06/04/2000 Document Revised: 06/28/2014 Document Reviewed: 03/30/2013 °Elsevier Interactive Patient Education ©2016 Elsevier Inc. ° ° ° °

## 2015-12-17 NOTE — Progress Notes (Signed)
Urine: trace of hgb/wbcs, 40 ketones, small bilirubin Pt c/o all over itching at night

## 2015-12-18 LAB — COMPREHENSIVE METABOLIC PANEL
ALK PHOS: 115 U/L (ref 33–115)
ALT: 17 U/L (ref 6–29)
AST: 22 U/L (ref 10–30)
Albumin: 3.3 g/dL — ABNORMAL LOW (ref 3.6–5.1)
BILIRUBIN TOTAL: 0.4 mg/dL (ref 0.2–1.2)
BUN: 6 mg/dL — AB (ref 7–25)
CALCIUM: 8.8 mg/dL (ref 8.6–10.2)
CO2: 22 mmol/L (ref 20–31)
Chloride: 104 mmol/L (ref 98–110)
Creat: 0.62 mg/dL (ref 0.50–1.10)
GLUCOSE: 103 mg/dL — AB (ref 65–99)
Potassium: 3.8 mmol/L (ref 3.5–5.3)
Sodium: 137 mmol/L (ref 135–146)
TOTAL PROTEIN: 6.4 g/dL (ref 6.1–8.1)

## 2015-12-21 LAB — BILE ACIDS, TOTAL: Bile Acids Total: 9 umol/L (ref 0–19)

## 2015-12-24 ENCOUNTER — Ambulatory Visit (INDEPENDENT_AMBULATORY_CARE_PROVIDER_SITE_OTHER): Payer: BC Managed Care – PPO | Admitting: Obstetrics and Gynecology

## 2015-12-24 VITALS — BP 116/81 | HR 86 | Wt 199.0 lb

## 2015-12-24 DIAGNOSIS — Z3483 Encounter for supervision of other normal pregnancy, third trimester: Secondary | ICD-10-CM | POA: Diagnosis not present

## 2015-12-24 LAB — POCT URINALYSIS DIP (DEVICE)
Bilirubin Urine: NEGATIVE
Glucose, UA: NEGATIVE mg/dL
HGB URINE DIPSTICK: NEGATIVE
Ketones, ur: NEGATIVE mg/dL
NITRITE: NEGATIVE
PH: 7 (ref 5.0–8.0)
Protein, ur: NEGATIVE mg/dL
SPECIFIC GRAVITY, URINE: 1.02 (ref 1.005–1.030)
UROBILINOGEN UA: 1 mg/dL (ref 0.0–1.0)

## 2015-12-24 NOTE — Patient Instructions (Signed)

## 2015-12-24 NOTE — Progress Notes (Signed)
.  cwho Subjective:  Claudia Graves is a 30 y.o. G3P1011 at 2248w6d being seen today for ongoing prenatal care.  Patient reports still having generalized pruritis at night and sometimes takes atarax with some relief.  Contractions: Not present.  Vag. Bleeding: None. Movement: Present. Denies leaking of fluid.   The following portions of the patient's history were reviewed and updated as appropriate: allergies, current medications, past family history, past medical history, past social history, past surgical history and problem list.   Objective:   Filed Vitals:   12/24/15 0829  BP: 116/81  Pulse: 86  Weight: 199 lb (90.266 kg)  Bile acids 9; CMP WNL  Fetal Status: Fetal Heart Rate (bpm): 144   Movement: Present     General:  Alert, oriented and cooperative. Patient is in no acute distress.  Skin: Skin is warm and dry. No rash noted.   Cardiovascular: Normal heart rate noted  Respiratory: Normal respiratory effort, no problems with respiration noted  Abdomen: Soft, gravid, appropriate for gestational age. Pain/Pressure: Absent     Vaginal: Vag. Bleeding: None.       Cervix: Not evaluated        Extremities: Normal range of motion.  Edema: Trace  Mental Status: Normal mood and affect. Normal behavior. Normal judgment and thought content.   Urinalysis:    pending  Assessment and Plan:  Pregnancy: G3P1011 at 2048w6d  1. Encounter for supervision of other normal pregnancy in third trimester .Generalized pruritis of pregnancy, normal lab eval  Term labor symptoms and general obstetric precautions including but not limited to vaginal bleeding, contractions, leaking of fluid and fetal movement were reviewed in detail with the patient. Please refer to After Visit Summary for other counseling recommendations.  Continue prn Vistaril Return in about 1 week (around 12/31/2015).   Danae Orleanseirdre C Tytan Sandate, CNM

## 2015-12-28 ENCOUNTER — Encounter (HOSPITAL_COMMUNITY): Payer: Self-pay | Admitting: *Deleted

## 2015-12-28 ENCOUNTER — Inpatient Hospital Stay (HOSPITAL_COMMUNITY): Payer: BC Managed Care – PPO | Admitting: Anesthesiology

## 2015-12-28 ENCOUNTER — Inpatient Hospital Stay (HOSPITAL_COMMUNITY)
Admission: AD | Admit: 2015-12-28 | Discharge: 2015-12-30 | DRG: 774 | Disposition: A | Discharge status: Home / Self Care | Payer: BC Managed Care – PPO | Source: Ambulatory Visit | Attending: Obstetrics & Gynecology | Admitting: Obstetrics & Gynecology

## 2015-12-28 DIAGNOSIS — K219 Gastro-esophageal reflux disease without esophagitis: Secondary | ICD-10-CM | POA: Diagnosis present

## 2015-12-28 DIAGNOSIS — O99824 Streptococcus B carrier state complicating childbirth: Secondary | ICD-10-CM | POA: Diagnosis present

## 2015-12-28 DIAGNOSIS — A6009 Herpesviral infection of other urogenital tract: Secondary | ICD-10-CM | POA: Diagnosis not present

## 2015-12-28 DIAGNOSIS — O9832 Other infections with a predominantly sexual mode of transmission complicating childbirth: Secondary | ICD-10-CM | POA: Diagnosis not present

## 2015-12-28 DIAGNOSIS — A6000 Herpesviral infection of urogenital system, unspecified: Secondary | ICD-10-CM | POA: Diagnosis present

## 2015-12-28 DIAGNOSIS — IMO0001 Reserved for inherently not codable concepts without codable children: Secondary | ICD-10-CM

## 2015-12-28 DIAGNOSIS — O9962 Diseases of the digestive system complicating childbirth: Secondary | ICD-10-CM | POA: Diagnosis present

## 2015-12-28 DIAGNOSIS — Z3A39 39 weeks gestation of pregnancy: Secondary | ICD-10-CM

## 2015-12-28 DIAGNOSIS — Z87891 Personal history of nicotine dependence: Secondary | ICD-10-CM

## 2015-12-28 LAB — CBC
HCT: 33.6 % — ABNORMAL LOW (ref 36.0–46.0)
HCT: 30.5 % — ABNORMAL LOW (ref 36.0–46.0)
HEMOGLOBIN: 12 g/dL (ref 12.0–15.0)
Hemoglobin: 11 g/dL — ABNORMAL LOW (ref 12.0–15.0)
MCH: 31.8 pg (ref 26.0–34.0)
MCH: 32 pg (ref 26.0–34.0)
MCHC: 35.7 g/dL (ref 30.0–36.0)
MCHC: 36.1 g/dL — AB (ref 30.0–36.0)
MCV: 89.1 fL (ref 78.0–100.0)
MCV: 88.7 fL (ref 78.0–100.0)
PLATELETS: 123 10*3/uL — AB (ref 150–400)
PLATELETS: 116 10*3/uL — AB (ref 150–400)
RBC: 3.77 MIL/uL — ABNORMAL LOW (ref 3.87–5.11)
RBC: 3.44 MIL/uL — ABNORMAL LOW (ref 3.87–5.11)
RDW: 13.4 % (ref 11.5–15.5)
RDW: 13.2 % (ref 11.5–15.5)
WBC: 15 10*3/uL — ABNORMAL HIGH (ref 4.0–10.5)
WBC: 17.6 10*3/uL — ABNORMAL HIGH (ref 4.0–10.5)

## 2015-12-28 LAB — AMNISURE RUPTURE OF MEMBRANE (ROM) NOT AT ARMC: AMNISURE: NEGATIVE

## 2015-12-28 LAB — TYPE AND SCREEN
ABO/RH(D): O POS
ANTIBODY SCREEN: NEGATIVE

## 2015-12-28 MED ORDER — LIDOCAINE HCL (PF) 1 % IJ SOLN
30.0000 mL | INTRAMUSCULAR | Status: DC | PRN
  Filled 2015-12-28: qty 30

## 2015-12-28 MED ORDER — OXYCODONE-ACETAMINOPHEN 5-325 MG PO TABS: 1.0000 | ORAL_TABLET | ORAL | Status: DC | PRN

## 2015-12-28 MED ORDER — EPHEDRINE 5 MG/ML INJ
10.0000 mg | INTRAVENOUS | Status: DC | PRN
  Filled 2015-12-28: qty 2

## 2015-12-28 MED ORDER — SOD CITRATE-CITRIC ACID 500-334 MG/5ML PO SOLN: 30.0000 mL | ORAL | Status: DC | PRN

## 2015-12-28 MED ORDER — ONDANSETRON HCL 4 MG/2ML IJ SOLN: 4.0000 mg | INTRAMUSCULAR | Status: DC | PRN

## 2015-12-28 MED ORDER — MEASLES, MUMPS & RUBELLA VAC ~~LOC~~ INJ: 0.5000 mL | INJECTION | Freq: Once | SUBCUTANEOUS | Status: DC

## 2015-12-28 MED ORDER — LACTATED RINGERS IV SOLN
500.0000 mL | Freq: Once | INTRAVENOUS | Status: AC
  Administered 2015-12-28: 500 mL via INTRAVENOUS

## 2015-12-28 MED ORDER — SODIUM CHLORIDE 0.9% FLUSH: 3.0000 mL | INTRAVENOUS | Status: DC | PRN

## 2015-12-28 MED ORDER — ONDANSETRON HCL 4 MG/2ML IJ SOLN: 4.0000 mg | Freq: Four times a day (QID) | INTRAMUSCULAR | Status: DC | PRN

## 2015-12-28 MED ORDER — SIMETHICONE 80 MG PO CHEW: 80.0000 mg | CHEWABLE_TABLET | ORAL | Status: DC | PRN

## 2015-12-28 MED ORDER — PHENYLEPHRINE 40 MCG/ML (10ML) SYRINGE FOR IV PUSH (FOR BLOOD PRESSURE SUPPORT)
80.0000 ug | PREFILLED_SYRINGE | INTRAVENOUS | Status: DC | PRN
  Filled 2015-12-28: qty 5

## 2015-12-28 MED ORDER — OXYTOCIN BOLUS FROM INFUSION: 500.0000 mL | INTRAVENOUS | Status: DC

## 2015-12-28 MED ORDER — ACETAMINOPHEN 325 MG PO TABS: 650.0000 mg | ORAL_TABLET | ORAL | Status: DC | PRN

## 2015-12-28 MED ORDER — LIDOCAINE HCL (PF) 1 % IJ SOLN
INTRAMUSCULAR | Status: DC | PRN
  Administered 2015-12-28 (×2): 4 mL via EPIDURAL

## 2015-12-28 MED ORDER — ONDANSETRON HCL 4 MG PO TABS: 4.0000 mg | ORAL_TABLET | ORAL | Status: DC | PRN

## 2015-12-28 MED ORDER — SODIUM CHLORIDE 0.9 % IV SOLN
2.0000 g | Freq: Once | INTRAVENOUS | Status: AC
  Administered 2015-12-28: 2 g via INTRAVENOUS
  Filled 2015-12-28: qty 2000

## 2015-12-28 MED ORDER — OXYTOCIN 40 UNITS IN LACTATED RINGERS INFUSION - SIMPLE MED
1.0000 m[IU]/min | INTRAVENOUS | Status: DC
  Administered 2015-12-28: 2 m[IU]/min via INTRAVENOUS
  Filled 2015-12-28: qty 1000

## 2015-12-28 MED ORDER — FENTANYL 2.5 MCG/ML BUPIVACAINE 1/10 % EPIDURAL INFUSION (WH - ANES)
14.0000 mL/h | INTRAMUSCULAR | Status: DC | PRN
  Administered 2015-12-28 (×2): 14 mL/h via EPIDURAL
  Filled 2015-12-28 (×2): qty 125

## 2015-12-28 MED ORDER — PHENYLEPHRINE 40 MCG/ML (10ML) SYRINGE FOR IV PUSH (FOR BLOOD PRESSURE SUPPORT)
80.0000 ug | PREFILLED_SYRINGE | INTRAVENOUS | Status: DC | PRN
  Filled 2015-12-28: qty 10

## 2015-12-28 MED ORDER — BENZOCAINE-MENTHOL 20-0.5 % EX AERO: 1.0000 "application " | INHALATION_SPRAY | CUTANEOUS | Status: DC | PRN

## 2015-12-28 MED ORDER — COCONUT OIL OIL: 1.0000 "application " | TOPICAL_OIL | Status: DC | PRN

## 2015-12-28 MED ORDER — PRENATAL MULTIVITAMIN CH
1.0000 | ORAL_TABLET | Freq: Every day | ORAL | Status: DC
  Administered 2015-12-29 – 2015-12-30 (×2): 1 via ORAL
  Filled 2015-12-28 (×2): qty 1

## 2015-12-28 MED ORDER — WITCH HAZEL-GLYCERIN EX PADS: 1.0000 "application " | MEDICATED_PAD | CUTANEOUS | Status: DC | PRN

## 2015-12-28 MED ORDER — AMPICILLIN SODIUM 2 G IJ SOLR: 2.0000 g | Freq: Once | INTRAMUSCULAR | Status: DC

## 2015-12-28 MED ORDER — DIPHENHYDRAMINE HCL 50 MG/ML IJ SOLN: 12.5000 mg | INTRAMUSCULAR | Status: DC | PRN

## 2015-12-28 MED ORDER — OXYTOCIN 40 UNITS IN LACTATED RINGERS INFUSION - SIMPLE MED: 2.5000 [IU]/h | INTRAVENOUS | Status: DC

## 2015-12-28 MED ORDER — ZOLPIDEM TARTRATE 5 MG PO TABS: 5.0000 mg | ORAL_TABLET | Freq: Every evening | ORAL | Status: DC | PRN

## 2015-12-28 MED ORDER — SENNOSIDES-DOCUSATE SODIUM 8.6-50 MG PO TABS
2.0000 | ORAL_TABLET | ORAL | Status: DC
  Administered 2015-12-29 – 2015-12-30 (×2): 2 via ORAL
  Filled 2015-12-28 (×2): qty 2

## 2015-12-28 MED ORDER — DIBUCAINE 1 % RE OINT: 1.0000 "application " | TOPICAL_OINTMENT | RECTAL | Status: DC | PRN

## 2015-12-28 MED ORDER — IBUPROFEN 600 MG PO TABS
600.0000 mg | ORAL_TABLET | Freq: Four times a day (QID) | ORAL | Status: DC
  Administered 2015-12-28 – 2015-12-30 (×8): 600 mg via ORAL
  Filled 2015-12-28 (×8): qty 1

## 2015-12-28 MED ORDER — SODIUM CHLORIDE 0.9 % IV SOLN: 250.0000 mL | INTRAVENOUS | Status: DC | PRN

## 2015-12-28 MED ORDER — LACTATED RINGERS IV SOLN
INTRAVENOUS | Status: DC
  Administered 2015-12-28 (×2): via INTRAVENOUS

## 2015-12-28 MED ORDER — LACTATED RINGERS IV SOLN: 500.0000 mL | INTRAVENOUS | Status: DC | PRN

## 2015-12-28 MED ORDER — DIPHENHYDRAMINE HCL 25 MG PO CAPS: 25.0000 mg | ORAL_CAPSULE | Freq: Four times a day (QID) | ORAL | Status: DC | PRN

## 2015-12-28 MED ORDER — OXYCODONE-ACETAMINOPHEN 5-325 MG PO TABS: 2.0000 | ORAL_TABLET | ORAL | Status: DC | PRN

## 2015-12-28 MED ORDER — TERBUTALINE SULFATE 1 MG/ML IJ SOLN
0.2500 mg | Freq: Once | INTRAMUSCULAR | Status: DC | PRN
  Filled 2015-12-28: qty 1

## 2015-12-28 MED ORDER — SODIUM CHLORIDE 0.9% FLUSH: 3.0000 mL | Freq: Two times a day (BID) | INTRAVENOUS | Status: DC

## 2015-12-28 MED ORDER — TETANUS-DIPHTH-ACELL PERTUSSIS 5-2.5-18.5 LF-MCG/0.5 IM SUSP: 0.5000 mL | Freq: Once | INTRAMUSCULAR | Status: DC

## 2015-12-28 NOTE — Anesthesia Pain Management Evaluation Note (Signed)
  [facility] Pain Management Visit Note  Patient: Dorcas CarrowJacqueline E Maahs, 30 y.o., female  "[facility]. We have an anesthesia team available at all times to provide care throughout the hospital, including epidural management and anesthesia for C-section. I don't know your plan for the delivery whether it a natural birth, water birth, IV sedation, nitrous supplementation, doula or epidural, but we want to meet your pain goals."   1.Was your pain managed to your expectations on prior hospitalizations?     2.What is your expectation for pain management during this hospitalization?     3.How can we help you reach that goal?   Record the patient's initial score and the patient's pain goal.   Pain: 0  Pain Goal: 3 [facility] wants you to be able to say your pain was always managed very well.  Laban EmperorMalinova,Filomena Pokorney Hristova 12/28/2015

## 2015-12-28 NOTE — Anesthesia Procedure Notes (Signed)
Epidural Patient location during procedure: OB Start time: 12/28/2015 10:37 AM  Staffing Anesthesiologist: Mal AmabileFOSTER, Khady Vandenberg Performed by: anesthesiologist   Preanesthetic Checklist Completed: patient identified, site marked, surgical consent, pre-op evaluation, timeout performed, IV checked, risks and benefits discussed and monitors and equipment checked  Epidural Patient position: sitting Prep: site prepped and draped and DuraPrep Patient monitoring: continuous pulse ox and blood pressure Approach: midline Location: L3-L4 Injection technique: LOR air  Needle:  Needle type: Tuohy  Needle gauge: 17 G Needle length: 9 cm and 9 Needle insertion depth: 5 cm cm Catheter type: closed end flexible Catheter size: 19 Gauge Catheter at skin depth: 10 cm Test dose: negative and Other  Assessment Events: blood not aspirated, injection not painful, no injection resistance, negative IV test and no paresthesia  Additional Notes Patient identified. Risks and benefits discussed including failed block, incomplete  Pain control, post dural puncture headache, nerve damage, paralysis, blood pressure Changes, nausea, vomiting, reactions to medications-both toxic and allergic and post Partum back pain. All questions were answered. Patient expressed understanding and wished to proceed. Sterile technique was used throughout procedure. Epidural site was Dressed with sterile barrier dressing. No paresthesias, signs of intravascular injection Or signs of intrathecal spread were encountered.  Patient was more comfortable after the epidural was dosed. Please see RN's note for documentation of vital signs and FHR which are stable.

## 2015-12-28 NOTE — H&P (Signed)
S:  [name] is a 30 y.o. 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 The patient is feeling contractions every 3 mins. She is getting her care downstairs in the low risk clinic.   She denies vaginal lesions, or active outbreak at this time. She denies vaginal bleeding. + fetal movement.   O:  GENERAL: Well-developed, well-nourished female in no acute distress.  LUNGS: Effort normal SKIN: Warm, dry and without erythema PSYCH: Normal mood and affect  Filed Vitals:   12/28/15 0831 12/28/15 0849  BP: 115/83 112/78  Pulse: 90 92  Temp: 97.5 F (36.4 C) 97.8 F (36.6 C)  Resp: 33     Dilation: 6 Effacement (%): 80 Station: -2 Presentation: Vertex Exam by:: Ronna PolioElizabeth D'Andrea, RN     A:  Active labor at Term gestation Amnisure pending GBS positive  Category 1 fetal tracing    P:  Admit to labor and delivery; Zerita Boersarlene Lawson CNM aware Ampicillin IV  Epidural per request   Duane LopeJennifer I Rasch, NP 12/28/2015 9:31 AM

## 2015-12-28 NOTE — Anesthesia Preprocedure Evaluation (Addendum)
Anesthesia Evaluation  Patient identified by MRN, date of birth, ID band Patient awake    Reviewed: Allergy & Precautions, Patient's Chart, lab work & pertinent test results  Airway Mallampati: II  TM Distance: >3 FB Neck ROM: Full    Dental no notable dental hx. (+) Teeth Intact   Pulmonary former smoker,    Pulmonary exam normal breath sounds clear to auscultation       Cardiovascular negative cardio ROS Normal cardiovascular exam Rhythm:Regular Rate:Normal     Neuro/Psych negative neurological ROS  negative psych ROS   GI/Hepatic negative GI ROS, Neg liver ROS, GERD  ,  Endo/Other  Obesity  Renal/GU negative Renal ROS  negative genitourinary   Musculoskeletal   Abdominal (+) + obese,   Peds  Hematology negative hematology ROS (+) Thrombocytopenia-mild   Anesthesia Other Findings   Reproductive/Obstetrics (+) Pregnancy HSV                            Anesthesia Physical Anesthesia Plan  ASA: II  Anesthesia Plan: Epidural   Post-op Pain Management:    Induction:   Airway Management Planned: Natural Airway  Additional Equipment:   Intra-op Plan:   Post-operative Plan:   Informed Consent: I have reviewed the patients History and Physical, chart, labs and discussed the procedure including the risks, benefits and alternatives for the proposed anesthesia with the patient or authorized representative who has indicated his/her understanding and acceptance.     Plan Discussed with: Anesthesiologist  Anesthesia Plan Comments: (Will repeat CBC post partum prior to removing Epidural catheter. Order placed.)       Anesthesia Quick Evaluation

## 2015-12-28 NOTE — MAU Note (Signed)
Pt states she is having severe pains that go from her lower abdomen to her back.  Pt states the pains come and go at least every 10 minutes if not sooner.  Pt denies bleeding.  Pt states she is feeling the baby move.

## 2015-12-28 NOTE — Progress Notes (Signed)
Dorcas CarrowJacqueline E Fillion is a 30 y.o. G3P1011 at 1962w3d by ultrasound admitted for active labor  Subjective:   Objective: BP 107/59 mmHg  Pulse 77  Temp(Src) 98.6 F (37 C) (Oral)  Resp 16  Ht 5\' 8"  (1.727 m)  Wt 197 lb (89.359 kg)  BMI 29.96 kg/m2  SpO2 97%  LMP 03/24/2015      FHT:  FHR: 135-140 bpm, variability: moderate,  accelerations:  Present,  decelerations:  Absent UC:   irregular, every 3-5 minutes SVE:   Dilation: 6 Effacement (%): 80 Station: -2 Exam by:: Ronna PolioElizabeth D'Andrea, RN  Labs: Lab Results  Component Value Date   WBC 15.0* 12/28/2015   HGB 12.0 12/28/2015   HCT 33.6* 12/28/2015   MCV 89.1 12/28/2015   PLT 123* 12/28/2015    Assessment / Plan: Spontaneous labor, progressing normally  Labor: Progressing normally Preeclampsia:  no signs or symptoms of toxicity and intake and ouput balanced Fetal Wellbeing:  Category I Pain Control:  Epidural I/D:  n/a Anticipated MOD:  NSVD  Wyvonnia DuskyMarie Sheilla Maris 12/28/2015, 11:50 AM

## 2015-12-29 LAB — RPR: RPR: NONREACTIVE

## 2015-12-29 NOTE — Progress Notes (Signed)
Post Partum Day 1 Subjective: no complaints, up ad lib, voiding and tolerating PO  Objective: Blood pressure 106/56, pulse 66, temperature 98.7 F (37.1 C), temperature source Oral, resp. rate 16, height 5\' 8"  (1.727 m), weight 197 lb (89.359 kg), last menstrual period 03/24/2015, SpO2 99 %, unknown if currently breastfeeding.  Physical Exam:  General: alert, cooperative, appears stated age and no distress Lochia: appropriate Uterine Fundus: firm Incision: n/a DVT Evaluation: No evidence of DVT seen on physical exam. Negative Homan's sign. No cords or calf tenderness.   Recent Labs  12/28/15 0948 12/28/15 1642  HGB 12.0 11.0*  HCT 33.6* 30.5*    Assessment/Plan: Plan for discharge tomorrow   LOS: 1 day   Wyvonnia DuskyMarie Raynor Calcaterra 12/29/2015, 5:53 AM

## 2015-12-29 NOTE — Anesthesia Postprocedure Evaluation (Signed)
Anesthesia Post Note  Patient: Claudia Graves  Procedure(s) Performed: * No procedures listed *  Patient location during evaluation: Mother Baby Anesthesia Type: Epidural Level of consciousness: awake and alert Pain management: pain level controlled Vital Signs Assessment: post-procedure vital signs reviewed and stable Respiratory status: spontaneous breathing, nonlabored ventilation and respiratory function stable Cardiovascular status: stable Postop Assessment: no headache, no backache and epidural receding Anesthetic complications: no     Last Vitals:  Filed Vitals:   12/28/15 2200 12/29/15 0600  BP: 106/56 105/69  Pulse: 66 67  Temp: 37.1 C 36.8 C  Resp: 16 19    Last Pain:  Filed Vitals:   12/29/15 0647  PainSc: 4    Pain Goal:                 [name],[name]

## 2015-12-29 NOTE — Lactation Note (Signed)
This note was copied from a baby's chart. Lactation Consultation Note Mom didn't BF her other children. Moms isn't sure how long she will Bf this baby. States she is going to try to BF and see how it goes. Discussed pumping. Mom has large pendulum heavy breast w/everted nipple. Encouraged to wear supportive bra today. Mom states BF in football position. Baby is getting bath at this time. Mom encouraged to feed baby 8-12 times/24 hours and with feeding cues.  Educated about newborn behavior, STS, I&O, cluster feeding, supply and demand. Referred to Baby and Me Book in Breastfeeding section Pg. 22-23 for position options and Proper latch demonstration. [facility]/LC brochure given w/resources, support groups and LC services.  Patient Name: Claudia Graves [id number]'s Date: 12/29/2015 Reason for consult: Initial assessment   Maternal Data Has patient been taught Hand Expression?: Yes  Feeding Feeding Type: Breast Fed Length of feed: 20 min  LATCH Score/Interventions       Type of Nipple: Everted at rest and after stimulation  Comfort (Breast/Nipple): Soft / non-tender     Intervention(s): Skin to skin;Position options;Support Pillows;Breastfeeding basics reviewed     Lactation Tools Discussed/Used WIC Program: No   Consult Status Consult Status: Follow-up Date: 12/30/15 Follow-up type: In-patient    Charyl DancerCARVER, Dezmond Downie G 12/29/2015, 6:56 AM

## 2015-12-30 MED ORDER — IBUPROFEN 600 MG PO TABS
600.0000 mg | ORAL_TABLET | Freq: Four times a day (QID) | ORAL | Status: AC | PRN
Start: 2015-12-30 — End: ?

## 2015-12-30 NOTE — Discharge Instructions (Signed)
Breastfeeding °Deciding to breastfeed is one of the best choices you can make for you and your baby. A change in hormones during pregnancy causes your breast tissue to grow and increases the number and size of your milk ducts. These hormones also allow proteins, sugars, and fats from your blood supply to make breast milk in your milk-producing glands. Hormones prevent breast milk from being released before your baby is born as well as prompt milk flow after birth. Once breastfeeding has begun, thoughts of your baby, as well as his or her sucking or crying, can stimulate the release of milk from your milk-producing glands.  °BENEFITS OF BREASTFEEDING °For Your Baby °· Your first milk (colostrum) helps your baby's digestive system function better. °· There are antibodies in your milk that help your baby fight off infections. °· Your baby has a lower incidence of asthma, allergies, and sudden infant death syndrome. °· The nutrients in breast milk are better for your baby than infant formulas and are designed uniquely for your baby's needs. °· Breast milk improves your baby's brain development. °· Your baby is less likely to develop other conditions, such as childhood obesity, asthma, or type 2 diabetes mellitus. °For You °· Breastfeeding helps to create a very special bond between you and your baby. °· Breastfeeding is convenient. Breast milk is always available at the correct temperature and costs nothing. °· Breastfeeding helps to burn calories and helps you lose the weight gained during pregnancy. °· Breastfeeding makes your uterus contract to its prepregnancy size faster and slows bleeding (lochia) after you give birth.   °· Breastfeeding helps to lower your risk of developing type 2 diabetes mellitus, osteoporosis, and breast or ovarian cancer later in life. °SIGNS THAT YOUR BABY IS HUNGRY °Early Signs of Hunger °· Increased alertness or activity. °· Stretching. °· Movement of the head from side to  side. °· Movement of the head and opening of the mouth when the corner of the mouth or cheek is stroked (rooting). °· Increased sucking sounds, smacking lips, cooing, sighing, or squeaking. °· Hand-to-mouth movements. °· Increased sucking of fingers or hands. °Late Signs of Hunger °· Fussing. °· Intermittent crying. °Extreme Signs of Hunger °Signs of extreme hunger will require calming and consoling before your baby will be able to breastfeed successfully. Do not wait for the following signs of extreme hunger to occur before you initiate breastfeeding: °· Restlessness. °· A loud, strong cry. °· Screaming. °BREASTFEEDING BASICS °Breastfeeding Initiation °· Find a comfortable place to sit or lie down, with your neck and back well supported. °· Place a pillow or rolled up blanket under your baby to bring him or her to the level of your breast (if you are seated). Nursing pillows are specially designed to help support your arms and your baby while you breastfeed. °· Make sure that your baby's abdomen is facing your abdomen. °· Gently massage your breast. With your fingertips, massage from your chest wall toward your nipple in a circular motion. This encourages milk flow. You may need to continue this action during the feeding if your milk flows slowly. °· Support your breast with 4 fingers underneath and your thumb above your nipple. Make sure your fingers are well away from your nipple and your baby's mouth. °· Stroke your baby's lips gently with your finger or nipple. °· When your baby's mouth is open wide enough, quickly bring your baby to your breast, placing your entire nipple and as much of the colored area around your nipple (  areola) as possible into your baby's mouth. °¨ More areola should be visible above your baby's upper lip than below the lower lip. °¨ Your baby's tongue should be between his or her lower gum and your breast. °· Ensure that your baby's mouth is correctly positioned around your nipple  (latched). Your baby's lips should create a seal on your breast and be turned out (everted). °· It is common for your baby to suck about 2-3 minutes in order to start the flow of breast milk. °Latching °Teaching your baby how to latch on to your breast properly is very important. An improper latch can cause nipple pain and decreased milk supply for you and poor weight gain in your baby. Also, if your baby is not latched onto your nipple properly, he or she may swallow some air during feeding. This can make your baby fussy. Burping your baby when you switch breasts during the feeding can help to get rid of the air. However, teaching your baby to latch on properly is still the best way to prevent fussiness from swallowing air while breastfeeding. °Signs that your baby has successfully latched on to your nipple: °· Silent tugging or silent sucking, without causing you pain. °· Swallowing heard between every 3-4 sucks. °· Muscle movement above and in front of his or her ears while sucking. °Signs that your baby has not successfully latched on to nipple: °· Sucking sounds or smacking sounds from your baby while breastfeeding. °· Nipple pain. °If you think your baby has not latched on correctly, slip your finger into the corner of your baby's mouth to break the suction and place it between your baby's gums. Attempt breastfeeding initiation again. °Signs of Successful Breastfeeding °Signs from your baby: °· A gradual decrease in the number of sucks or complete cessation of sucking. °· Falling asleep. °· Relaxation of his or her body. °· Retention of a small amount of milk in his or her mouth. °· Letting go of your breast by himself or herself. °Signs from you: °· Breasts that have increased in firmness, weight, and size 1-3 hours after feeding. °· Breasts that are softer immediately after breastfeeding. °· Increased milk volume, as well as a change in milk consistency and color by the fifth day of breastfeeding. °· Nipples  that are not sore, cracked, or bleeding. °Signs That Your Baby is Getting Enough Milk °· Wetting at least 3 diapers in a 24-hour period. The urine should be clear and pale yellow by age 5 days. °· At least 3 stools in a 24-hour period by age 5 days. The stool should be soft and yellow. °· At least 3 stools in a 24-hour period by age 7 days. The stool should be seedy and yellow. °· No loss of weight greater than 10% of birth weight during the first 3 days of age. °· Average weight gain of 4-7 ounces (113-198 g) per week after age 4 days. °· Consistent daily weight gain by age 5 days, without weight loss after the age of 2 weeks. °After a feeding, your baby may spit up a small amount. This is common. °BREASTFEEDING FREQUENCY AND DURATION °Frequent feeding will help you make more milk and can prevent sore nipples and breast engorgement. Breastfeed when you feel the need to reduce the fullness of your breasts or when your baby shows signs of hunger. This is called "breastfeeding on demand." Avoid introducing a pacifier to your baby while you are working to establish breastfeeding (the first 4-6 weeks   after your baby is born). After this time you may choose to use a pacifier. Research has shown that pacifier use during the first year of a baby's life decreases the risk of sudden infant death syndrome (SIDS). °Allow your baby to feed on each breast as long as he or she wants. Breastfeed until your baby is finished feeding. When your baby unlatches or falls asleep while feeding from the first breast, offer the second breast. Because newborns are often sleepy in the first few weeks of life, you may need to awaken your baby to get him or her to feed. °Breastfeeding times will vary from baby to baby. However, the following rules can serve as a guide to help you ensure that your baby is properly fed: °· Newborns (babies 4 weeks of age or younger) may breastfeed every 1-3 hours. °· Newborns should not go longer than 3 hours  during the day or 5 hours during the night without breastfeeding. °· You should breastfeed your baby a minimum of 8 times in a 24-hour period until you begin to introduce solid foods to your baby at around 6 months of age. °BREAST MILK PUMPING °Pumping and storing breast milk allows you to ensure that your baby is exclusively fed your breast milk, even at times when you are unable to breastfeed. This is especially important if you are going back to work while you are still breastfeeding or when you are not able to be present during feedings. Your lactation consultant can give you guidelines on how long it is safe to store breast milk. °A breast pump is a machine that allows you to pump milk from your breast into a sterile bottle. The pumped breast milk can then be stored in a refrigerator or freezer. Some breast pumps are operated by hand, while others use electricity. Ask your lactation consultant which type will work best for you. Breast pumps can be purchased, but some hospitals and breastfeeding support groups lease breast pumps on a monthly basis. A lactation consultant can teach you how to hand express breast milk, if you prefer not to use a pump. °CARING FOR YOUR BREASTS WHILE YOU BREASTFEED °Nipples can become dry, cracked, and sore while breastfeeding. The following recommendations can help keep your breasts moisturized and healthy: °· Avoid using soap on your nipples. °· Wear a supportive bra. Although not required, special nursing bras and tank tops are designed to allow access to your breasts for breastfeeding without taking off your entire bra or top. Avoid wearing underwire-style bras or extremely tight bras. °· Air dry your nipples for 3-4 minutes after each feeding. °· Use only cotton bra pads to absorb leaked breast milk. Leaking of breast milk between feedings is normal. °· Use lanolin on your nipples after breastfeeding. Lanolin helps to maintain your skin's normal moisture barrier. If you use  pure lanolin, you do not need to wash it off before feeding your baby again. Pure lanolin is not toxic to your baby. You may also hand express a few drops of breast milk and gently massage that milk into your nipples and allow the milk to air dry. °In the first few weeks after giving birth, some women experience extremely full breasts (engorgement). Engorgement can make your breasts feel heavy, warm, and tender to the touch. Engorgement peaks within 3-5 days after you give birth. The following recommendations can help ease engorgement: °· Completely empty your breasts while breastfeeding or pumping. You may want to start by applying warm, moist heat (in   the shower or with warm water-soaked hand towels) just before feeding or pumping. This increases circulation and helps the milk flow. If your baby does not completely empty your breasts while breastfeeding, pump any extra milk after he or she is finished. °· Wear a snug bra (nursing or regular) or tank top for 1-2 days to signal your body to slightly decrease milk production. °· Apply ice packs to your breasts, unless this is too uncomfortable for you. °· Make sure that your baby is latched on and positioned properly while breastfeeding. °If engorgement persists after 48 hours of following these recommendations, contact your health care provider or a lactation consultant. °OVERALL HEALTH CARE RECOMMENDATIONS WHILE BREASTFEEDING °· Eat healthy foods. Alternate between meals and snacks, eating 3 of each per day. Because what you eat affects your breast milk, some of the foods may make your baby more irritable than usual. Avoid eating these foods if you are sure that they are negatively affecting your baby. °· Drink milk, fruit juice, and water to satisfy your thirst (about 10 glasses a day). °· Rest often, relax, and continue to take your prenatal vitamins to prevent fatigue, stress, and anemia. °· Continue breast self-awareness checks. °· Avoid chewing and smoking  tobacco. Chemicals from cigarettes that pass into breast milk and exposure to secondhand smoke may harm your baby. °· Avoid alcohol and drug use, including marijuana. °Some medicines that may be harmful to your baby can pass through breast milk. It is important to ask your health care provider before taking any medicine, including all over-the-counter and prescription medicine as well as vitamin and herbal supplements. °It is possible to become pregnant while breastfeeding. If birth control is desired, ask your health care provider about options that will be safe for your baby. °SEEK MEDICAL CARE IF: °· You feel like you want to stop breastfeeding or have become frustrated with breastfeeding. °· You have painful breasts or nipples. °· Your nipples are cracked or bleeding. °· Your breasts are red, tender, or warm. °· You have a swollen area on either breast. °· You have a fever or chills. °· You have nausea or vomiting. °· You have drainage other than breast milk from your nipples. °· Your breasts do not become full before feedings by the fifth day after you give birth. °· You feel sad and depressed. °· Your baby is too sleepy to eat well. °· Your baby is having trouble sleeping.   °· Your baby is wetting less than 3 diapers in a 24-hour period. °· Your baby has less than 3 stools in a 24-hour period. °· Your baby's skin or the white part of his or her eyes becomes yellow.   °· Your baby is not gaining weight by 5 days of age. °SEEK IMMEDIATE MEDICAL CARE IF: °· Your baby is overly tired (lethargic) and does not want to wake up and feed. °· Your baby develops an unexplained fever. °  °This information is not intended to replace advice given to you by your health care provider. Make sure you discuss any questions you have with your health care provider. °  °Document Released: 06/07/2005 Document Revised: 02/26/2015 Document Reviewed: 11/29/2012 °Elsevier Interactive Patient Education ©2016 [employer] °Vaginal  Delivery, Care After °Refer to this sheet in the next few weeks. These discharge instructions provide you with information on caring for yourself after delivery. Your health care provider may also give you specific instructions. Your treatment has been planned according to the most current medical practices available, but   problems sometimes occur. Call your health care provider if you have any problems or questions after you go home. °HOME CARE INSTRUCTIONS °· Take over-the-counter or prescription medicines only as directed by your health care provider or pharmacist. °· Do not drink alcohol, especially if you are breastfeeding or taking medicine to relieve pain. °· Do not chew or smoke tobacco. °· Do not use illegal drugs. °· Continue to use good perineal care. Good perineal care includes: °· Wiping your perineum from front to back. °· Keeping your perineum clean. °· Do not use tampons or douche until your health care provider says it is okay. °· Shower, wash your hair, and take tub baths as directed by your health care provider. °· Wear a well-fitting bra that provides breast support. °· Eat healthy foods. °· Drink enough fluids to keep your urine clear or pale yellow. °· Eat high-fiber foods such as whole grain cereals and breads, brown rice, beans, and fresh fruits and vegetables every day. These foods may help prevent or relieve constipation. °· Follow your health care provider's recommendations regarding resumption of activities such as climbing stairs, driving, lifting, exercising, or traveling. °· Talk to your health care provider about resuming sexual activities. Resumption of sexual activities is dependent upon your risk of infection, your rate of healing, and your comfort and desire to resume sexual activity. °· Try to have someone help you with your household activities and your newborn for at least a few days after you leave the hospital. °· Rest as much as possible. Try to rest or take a nap when your  newborn is sleeping. °· Increase your activities gradually. °· Keep all of your scheduled postpartum appointments. It is very important to keep your scheduled follow-up appointments. At these appointments, your health care provider will be checking to make sure that you are healing physically and emotionally. °SEEK MEDICAL CARE IF:  °· You are passing large clots from your vagina. Save any clots to show your health care provider. °· You have a foul smelling discharge from your vagina. °· You have trouble urinating. °· You are urinating frequently. °· You have pain when you urinate. °· You have a change in your bowel movements. °· You have increasing redness, pain, or swelling near your vaginal incision (episiotomy) or vaginal tear. °· You have pus draining from your episiotomy or vaginal tear. °· Your episiotomy or vaginal tear is separating. °· You have painful, hard, or reddened breasts. °· You have a severe headache. °· You have blurred vision or see spots. °· You feel sad or depressed. °· You have thoughts of hurting yourself or your newborn. °· You have questions about your care, the care of your newborn, or medicines. °· You are dizzy or light-headed. °· You have a rash. °· You have nausea or vomiting. °· You were breastfeeding and have not had a menstrual period within 12 weeks after you stopped breastfeeding. °· You are not breastfeeding and have not had a menstrual period by the 12th week after delivery. °· You have a fever. °SEEK IMMEDIATE MEDICAL CARE IF:  °· You have persistent pain. °· You have chest pain. °· You have shortness of breath. °· You faint. °· You have leg pain. °· You have stomach pain. °· Your vaginal bleeding saturates two or more sanitary pads in 1 hour. °  °This information is not intended to replace advice given to you by your health care provider. Make sure you discuss any questions you have with your health   care provider. °  °Document Released: 06/04/2000 Document Revised: 02/26/2015  Document Reviewed: 02/02/2012 °Elsevier Interactive Patient Education ©2016 [employer] ° °

## 2015-12-30 NOTE — Progress Notes (Signed)
Mother declines MMR vaccine because on non-immune status of Rubella. She has been informed of the risk and the patient request to get the vaccine at her postpartum visit in the office. She does not want the potential of a "sore arm" after the injection and interference with baby care and feedings.  

## 2015-12-30 NOTE — Lactation Note (Signed)
This note was copied from a baby's chart. Lactation Consultation Note Mom BF when entered rm. Baby has only has 3 voids and 3 stools in 39 hrs. Baby is starting to cluster feed. Stressed importance of I&O. Hand expressed colostrum. Encouraged breast massage during feedings.  Patient Name: Claudia Graves UJWJX'BToday's Date: 12/30/2015 Reason for consult: Follow-up assessment;Infant weight loss   Maternal Data    Feeding Feeding Type: Breast Fed Length of feed: 15 min  LATCH Score/Interventions Latch: Grasps breast easily, tongue down, lips flanged, rhythmical sucking. Intervention(s): Adjust position  Audible Swallowing: Spontaneous and intermittent Intervention(s): Skin to skin;Hand expression;Alternate breast massage  Type of Nipple: Everted at rest and after stimulation  Comfort (Breast/Nipple): Soft / non-tender     Hold (Positioning): Assistance needed to correctly position infant at breast and maintain latch. Intervention(s): Position options  LATCH Score: 9  Lactation Tools Discussed/Used     Consult Status Consult Status: Follow-up Date: 12/31/15 Follow-up type: In-patient    Charyl DancerCARVER, Verania Salberg G 12/30/2015, 6:48 AM

## 2015-12-30 NOTE — Discharge Summary (Signed)
OB Discharge Summary     Patient Name: Claudia CarrowJacqueline E Roswell DOB: 04-Dec-1985 MRN: [id number]  Date of admission: 12/28/2015 Delivering MD: Wyvonnia DuskyLAWSON, MARIE D   Date of discharge: 12/30/2015  Admitting diagnosis: SEVERE PAIN Intrauterine pregnancy: [id number]     Secondary diagnosis:  Active Problems:   Active labor at term  Additional problems: none     Discharge diagnosis: Term Pregnancy Delivered                                                                                                Post partum procedures:none  Augmentation: none  Complications: None  Hospital course:  Onset of Labor With Vaginal Delivery     30 y.o. yo [id number] at 7116w3d was admitted in Active Labor on 12/28/2015. Patient had an uncomplicated labor course as follows:  Membrane Rupture Time/Date: 12:24 PM ,12/28/2015   Intrapartum Procedures: Episiotomy: None [1]                                         Lacerations:  None [1]  Patient had a delivery of a Viable infant. 12/28/2015  Information for the patient's newborn:  Geoffry Paradiseoney, Girl Judithe [[id number]][[id number]]  Delivery Method: Vaginal, Spontaneous Delivery (Filed from Delivery Summary)    Pateint had an uncomplicated postpartum course.  She is ambulating, tolerating a regular diet, passing flatus, and urinating well. Patient is discharged home in stable condition on 12/30/2015.    Physical exam  Filed Vitals:   12/28/15 2200 12/29/15 0600 12/29/15 1713 12/30/15 0550  BP: 106/56 105/69 109/73 107/75  Pulse: 66 67 87 61  Temp: 98.7 F (37.1 C) 98.2 F (36.8 C) 98.6 F (37 C) 98.3 F (36.8 C)  TempSrc: Oral Oral Oral   Resp: 16 19 18 18   Height:      Weight:      SpO2:       General: alert, cooperative and no distress Lochia: appropriate Uterine Fundus: firm Incision: N/A DVT Evaluation: No evidence of DVT seen on physical exam. No significant calf/ankle edema. Labs: Lab Results  Component Value Date   WBC 17.6* 12/28/2015   HGB 11.0* 12/28/2015   HCT  30.5* 12/28/2015   MCV 88.7 12/28/2015   PLT 116* 12/28/2015   CMP Latest Ref Rng 12/17/2015  Glucose 65 - 99 mg/dL 130(Q103(H)  BUN 7 - 25 mg/dL 6(L)  Creatinine 6.570.50 - 1.10 mg/dL 8.460.62  Sodium 962135 - 952146 mmol/L 137  Potassium 3.5 - 5.3 mmol/L 3.8  Chloride 98 - 110 mmol/L 104  CO2 20 - 31 mmol/L 22  Calcium 8.6 - 10.2 mg/dL 8.8  Total Protein 6.1 - 8.1 g/dL 6.4  Total Bilirubin 0.2 - 1.2 mg/dL 0.4  Alkaline Phos 33 - 115 U/L 115  AST 10 - 30 U/L 22  ALT 6 - 29 U/L 17    Discharge instruction: per After Visit Summary and "Baby and Me Booklet".  After visit meds:    Medication List    ASK your doctor about  these medications        acetaminophen 325 MG tablet  Commonly known as:  TYLENOL  Take 325 mg by mouth every 6 (six) hours as needed for headache. Reported on 12/24/2015     hydrOXYzine 25 MG tablet  Commonly known as:  ATARAX/VISTARIL  Take 2 tablets (50 mg total) by mouth every 6 (six) hours as needed for itching.     prenatal multivitamin Tabs tablet  Take 1 tablet by mouth daily at 12 noon. Reported on 12/10/2015     valACYclovir 1000 MG tablet  Commonly known as:  VALTREX  Take 1 tablet (1,000 mg total) by mouth daily.        Diet: routine diet  Activity: Advance as tolerated. Pelvic rest for 6 weeks.   Outpatient follow up:6 weeks Follow up Appt:No future appointments. Follow up Visit:No Follow-up on file.  Postpartum contraception: IUD Mirena  Newborn Data: Live born female  Birth Weight: 8 lb 4.1 oz (3745 g) APGAR: 8, 9  Baby Feeding: Breast Disposition:home with mother   12/30/2015 Loni Muse, MD   OB FELLOW DISCHARGE ATTESTATION  I have seen and examined this patient and agree with above documentation in the resident's note.   Silvano Bilis, MD 3:59 PM

## 2015-12-31 ENCOUNTER — Encounter: Payer: BC Managed Care – PPO | Admitting: Certified Nurse Midwife

## 2016-02-05 ENCOUNTER — Encounter: Payer: Self-pay | Admitting: *Deleted

## 2016-02-26 ENCOUNTER — Ambulatory Visit (INDEPENDENT_AMBULATORY_CARE_PROVIDER_SITE_OTHER): Payer: BC Managed Care – PPO | Admitting: Obstetrics and Gynecology

## 2016-02-26 ENCOUNTER — Encounter: Payer: Self-pay | Admitting: Obstetrics and Gynecology

## 2016-02-26 NOTE — Patient Instructions (Signed)
Intrauterine Device Information  An intrauterine device (IUD) is inserted into your uterus to prevent pregnancy. There are two types of IUDs available:   · Copper IUD--This type of IUD is wrapped in copper wire and is placed inside the uterus. Copper makes the uterus and fallopian tubes produce a fluid that kills sperm. The copper IUD can stay in place for 10 years.  · Hormone IUD--This type of IUD contains the hormone progestin (synthetic progesterone). The hormone thickens the cervical mucus and prevents sperm from entering the uterus. It also thins the uterine lining to prevent implantation of a fertilized egg. The hormone can weaken or kill the sperm that get into the uterus. One type of hormone IUD can stay in place for 5 years, and another type can stay in place for 3 years.  Your health care provider will make sure you are a good candidate for a contraceptive IUD. Discuss with your health care provider the possible side effects.   ADVANTAGES OF AN INTRAUTERINE DEVICE  · IUDs are highly effective, reversible, long acting, and low maintenance.    · There are no estrogen-related side effects.    · An IUD can be used when breastfeeding.    · IUDs are not associated with weight gain.    · The copper IUD works immediately after insertion.    · The hormone IUD works right away if inserted within 7 days of your period starting. You will need to use a backup method of birth control for 7 days if the hormone IUD is inserted at any other time in your cycle.  · The copper IUD does not interfere with your female hormones.    · The hormone IUD can make heavy menstrual periods lighter and decrease cramping.    · The hormone IUD can be used for 3 or 5 years.    · The copper IUD can be used for 10 years.  DISADVANTAGES OF AN INTRAUTERINE DEVICE  · The hormone IUD can be associated with irregular bleeding patterns.    · The copper IUD can make your menstrual flow heavier and more painful.    · You may experience cramping and  vaginal bleeding after insertion.       This information is not intended to replace advice given to you by your health care provider. Make sure you discuss any questions you have with your health care provider.     Document Released: 05/11/2004 Document Revised: 02/07/2013 Document Reviewed: 11/26/2012  Elsevier Interactive Patient Education ©2016 [employer]  Levonorgestrel intrauterine device (IUD)  What is this medicine?  LEVONORGESTREL IUD (LEE voe nor jes trel) is a contraceptive (birth control) device. The device is placed inside the uterus by a healthcare professional. It is used to prevent pregnancy and can also be used to treat heavy bleeding that occurs during your period. Depending on the device, it can be used for 3 to 5 years.  This medicine may be used for other purposes; ask your health care provider or pharmacist if you have questions.  What should I tell my health care provider before I take this medicine?  They need to know if you have any of these conditions:  -abnormal Pap smear  -cancer of the breast, uterus, or cervix  -diabetes  -endometritis  -genital or pelvic infection now or in the past  -have more than one sexual partner or your partner has more than one partner  -heart disease  -history of an ectopic or tubal pregnancy  -immune system problems  -IUD in place  -liver disease   or tumor  -problems with blood clots or take blood-thinners  -use intravenous drugs  -uterus of unusual shape  -vaginal bleeding that has not been explained  -an unusual or allergic reaction to levonorgestrel, other hormones, silicone, or polyethylene, medicines, foods, dyes, or preservatives  -pregnant or trying to get pregnant  -breast-feeding  How should I use this medicine?  This device is placed inside the uterus by a health care professional.  Talk to your pediatrician regarding the use of this medicine in children. Special care may be needed.  Overdosage: If you think you have taken too much of this medicine  contact a poison control center or emergency room at once.  NOTE: This medicine is only for you. Do not share this medicine with others.  What if I miss a dose?  This does not apply.  What may interact with this medicine?  Do not take this medicine with any of the following medications:  -amprenavir  -bosentan  -fosamprenavir  This medicine may also interact with the following medications:  -aprepitant  -barbiturate medicines for inducing sleep or treating seizures  -bexarotene  -griseofulvin  -medicines to treat seizures like carbamazepine, ethotoin, felbamate, oxcarbazepine, phenytoin, topiramate  -modafinil  -pioglitazone  -rifabutin  -rifampin  -rifapentine  -some medicines to treat HIV infection like atazanavir, indinavir, lopinavir, nelfinavir, tipranavir, ritonavir  -St. John's wort  -warfarin  This list may not describe all possible interactions. Give your health care provider a list of all the medicines, herbs, non-prescription drugs, or dietary supplements you use. Also tell them if you smoke, drink alcohol, or use illegal drugs. Some items may interact with your medicine.  What should I watch for while using this medicine?  Visit your doctor or health care professional for regular check ups. See your doctor if you or your partner has sexual contact with others, becomes HIV positive, or gets a sexual transmitted disease.  This product does not protect you against HIV infection (AIDS) or other sexually transmitted diseases.  You can check the placement of the IUD yourself by reaching up to the top of your vagina with clean fingers to feel the threads. Do not pull on the threads. It is a good habit to check placement after each menstrual period. Call your doctor right away if you feel more of the IUD than just the threads or if you cannot feel the threads at all.  The IUD may come out by itself. You may become pregnant if the device comes out. If you notice that the IUD has come out use a backup birth  control method like condoms and call your health care provider.  Using tampons will not change the position of the IUD and are okay to use during your period.  What side effects may I notice from receiving this medicine?  Side effects that you should report to your doctor or health care professional as soon as possible:  -allergic reactions like skin rash, itching or hives, swelling of the face, lips, or tongue  -fever, flu-like symptoms  -genital sores  -high blood pressure  -no menstrual period for 6 weeks during use  -pain, swelling, warmth in the leg  -pelvic pain or tenderness  -severe or sudden headache  -signs of pregnancy  -stomach cramping  -sudden shortness of breath  -trouble with balance, talking, or walking  -unusual vaginal bleeding, discharge  -yellowing of the eyes or skin  Side effects that usually do not require medical attention (report to your doctor or health   care professional if they continue or are bothersome):  -acne  -breast pain  -change in sex drive or performance  -changes in weight  -cramping, dizziness, or faintness while the device is being inserted  -headache  -irregular menstrual bleeding within first 3 to 6 months of use  -nausea  This list may not describe all possible side effects. Call your doctor for medical advice about side effects. You may report side effects to FDA at 1-800-FDA-1088.  Where should I keep my medicine?  This does not apply.  NOTE: This sheet is a summary. It may not cover all possible information. If you have questions about this medicine, talk to your doctor, pharmacist, or health care provider.     © 2016, Elsevier/Gold Standard. (2011-07-08 13:54:04)

## 2016-02-26 NOTE — Progress Notes (Signed)
Subjective:     Claudia Graves is a 30 y.o. female who presents for a postpartum visit. She is 8 weeks postpartum following a spontaneous vaginal delivery. I have fully reviewed the prenatal and intrapartum course. The delivery was at 39 gestational weeks. Outcome: spontaneous vaginal delivery. Anesthesia: epidural. Postpartum course has been unremarkable. Baby's course has been unremarkable. Baby is feeding by breast but mother is starting to think about transitioning to bottle as she will have to return to work. Bleeding spotting only starting today, but previously her bleeding has been completely resolved.. Bowel function is normal. Bladder function is normal. Patient is sexually active. Contraception method is condoms. Postpartum depression screening: negative.  The following portions of the patient's history were reviewed and updated as appropriate: past family history and past surgical history.  Review of Systems Pertinent items are noted in HPI.   Objective:    BP 130/90   Pulse 73   Ht 5\' 8"  (1.727 m)   Wt 174 lb 6.4 oz (79.1 kg)   LMP 02/26/2016   Breastfeeding? Yes   BMI 26.52 kg/m   General:  alert, cooperative and appears stated age   Breasts:  deffered as pt is actively breast feeding during visit  Lungs: clear to auscultation bilaterally  Heart:  regular rate and rhythm, S1, S2 normal, no murmur, click, rub or gallop  Abdomen: soft, non-tender; bowel sounds normal; no masses,  no organomegaly   Vulva:  not evaluated  Vagina: not evaluated  Cervix:  Not evaluated  Corpus: not examined  Adnexa:  not evaluated  Rectal Exam: Not performed.        Assessment:     Normal postpartum exam. Pap smear not done at today's visit.   Plan:    1. Contraception: pt would like IUD but is unsure how much it will cost with her insurance. She is going to call and schuled an appointment after she checks. She thinks she would like the mirena. 2. Cervical cancer screening: pt with an  ACSCUS HPV positive PAP in 2015. No pap since. Advised importance of getting PAP at this time. As she is bleeding she declined. I recommended f/u in the near future for repeat PAP. 3. Follow up in: 4 week or as needed. for birth control and PAP.

## 2016-12-10 IMAGING — US US OB COMP LESS 14 WK
1 series · 15 of 28 positions shown · non-contrast
Comparison: None.

CLINICAL DATA: Acute onset of lower abdominal pain. Initial
encounter.

EXAM:
OBSTETRIC <14 WK US AND TRANSVAGINAL OB US
TECHNIQUE: Both transabdominal and transvaginal ultrasound examinations were
performed for complete evaluation of the gestation as well as the
maternal uterus, adnexal regions, and pelvic cul-de-sac.
Transvaginal technique was performed to assess early pregnancy.

[Series 1: us ob comp less 14 wk · 69 acquisitions, 15 frames shown]
[im 1/69]
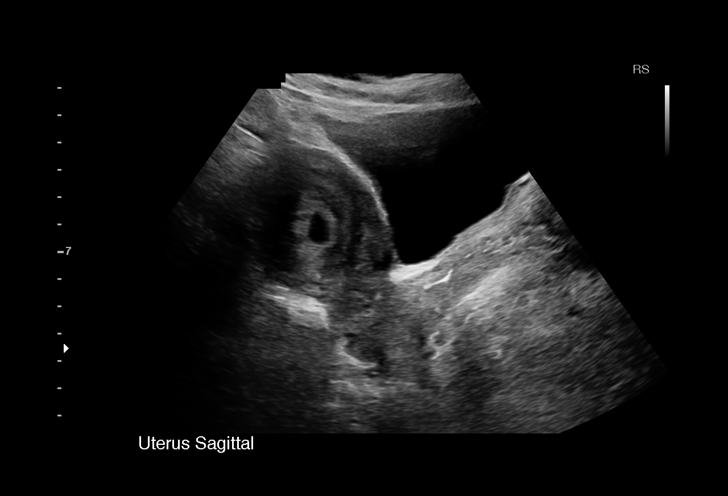
[im 6/69]
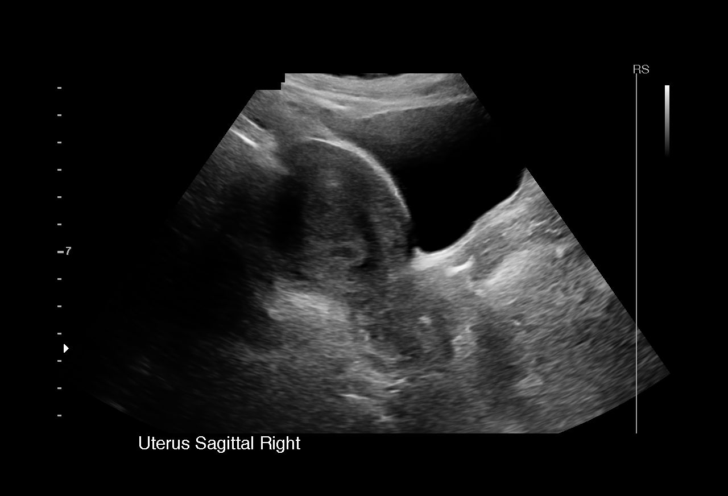
[im 11/69]
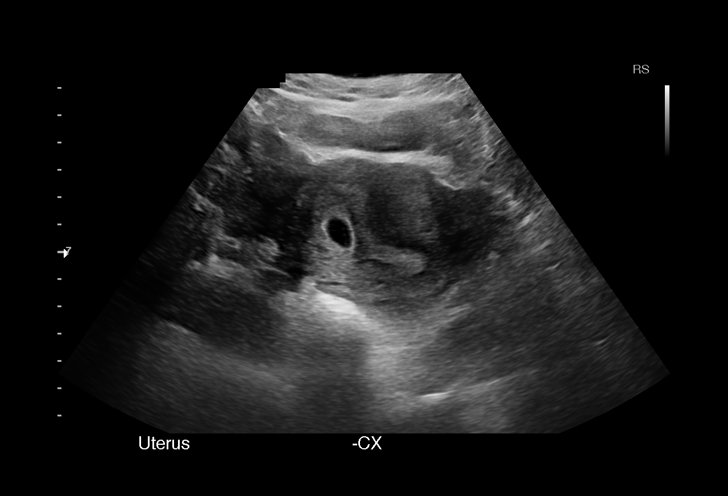
[im 16/69]
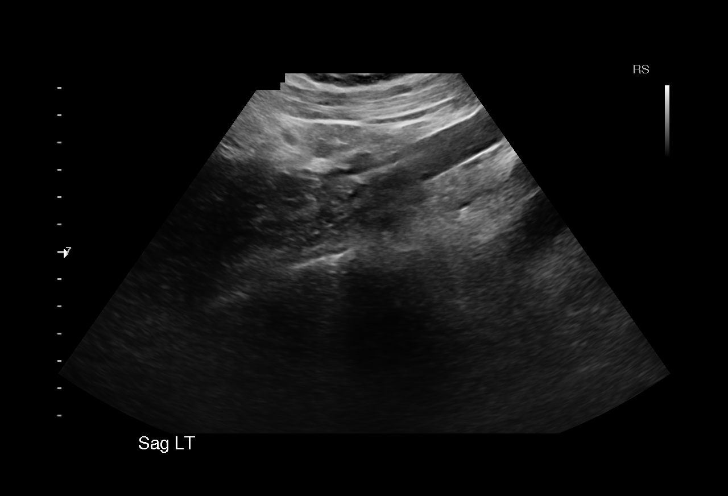
[im 21/69]
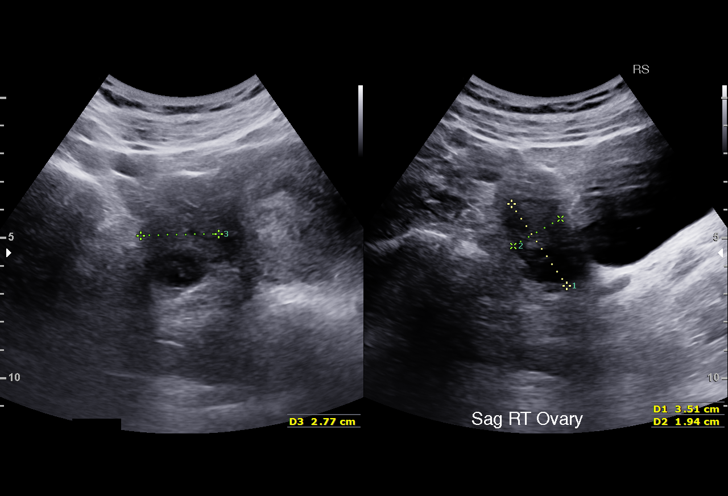
[im 26/69]
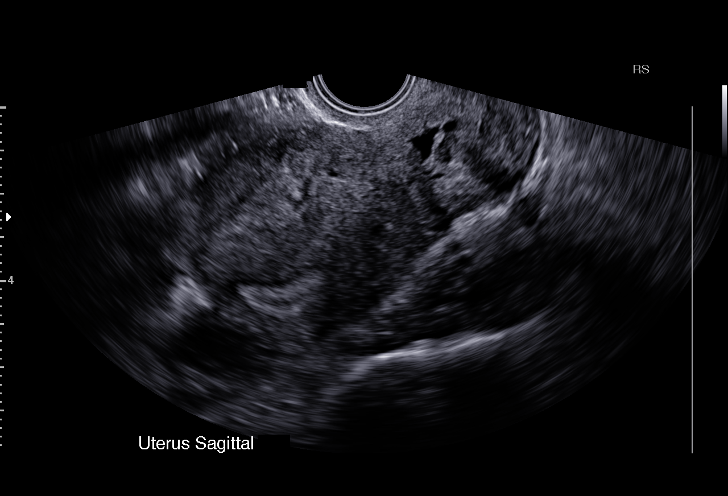
[im 31/69]
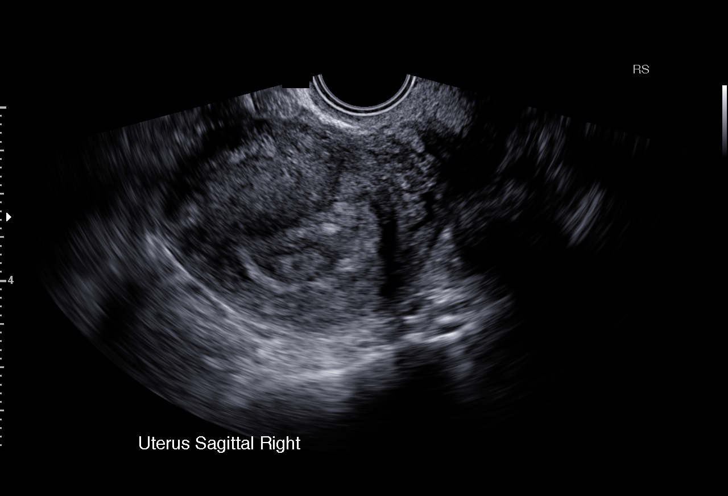
[im 36/69]
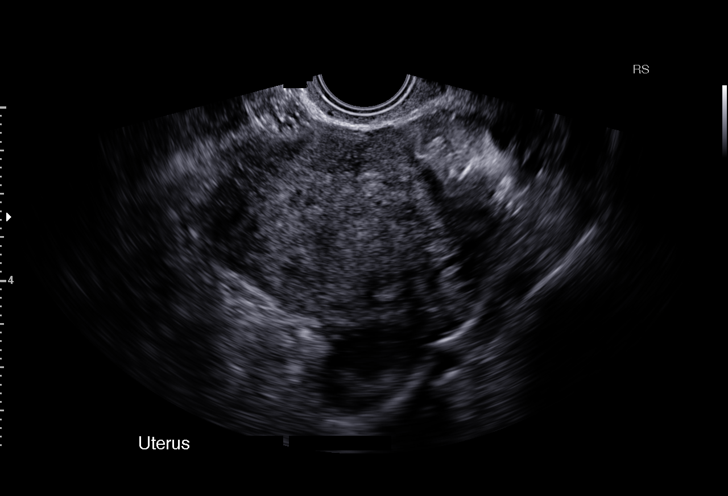
[im 38/69]
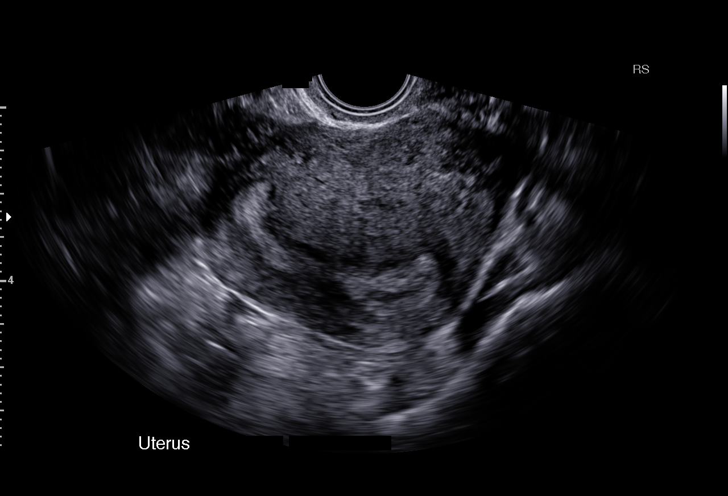
[im 43/69]
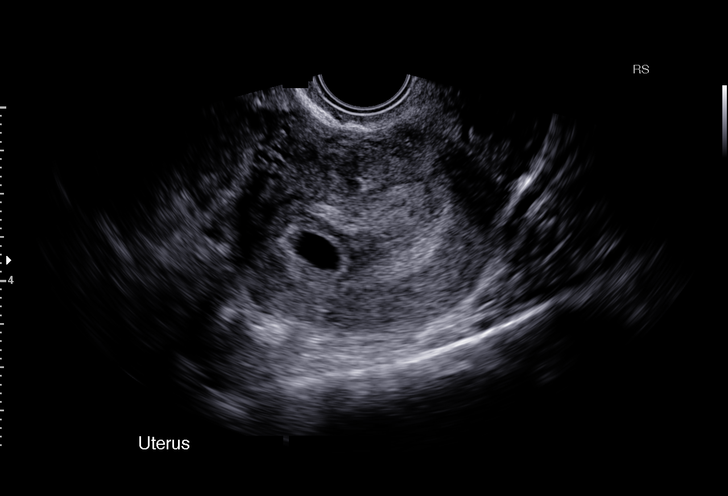
[im 48/69]
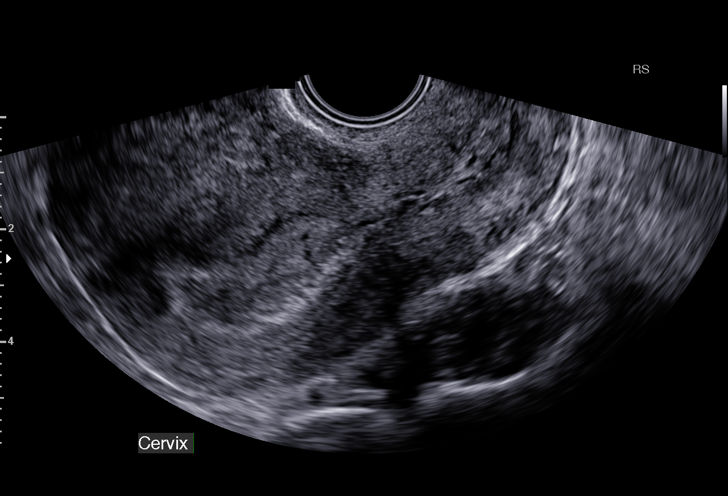
[im 53/69]
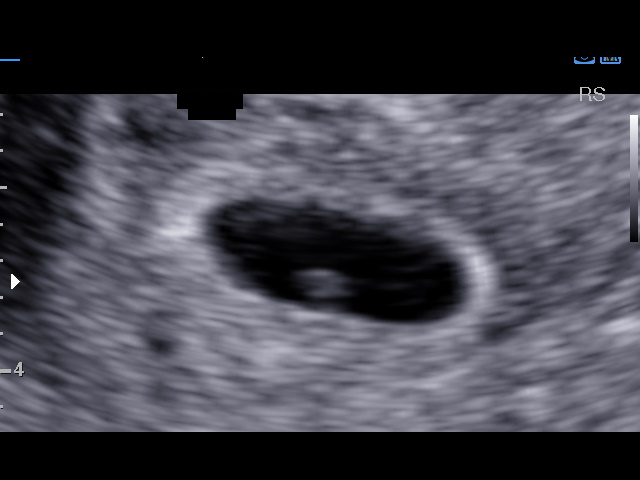
[im 58/69]
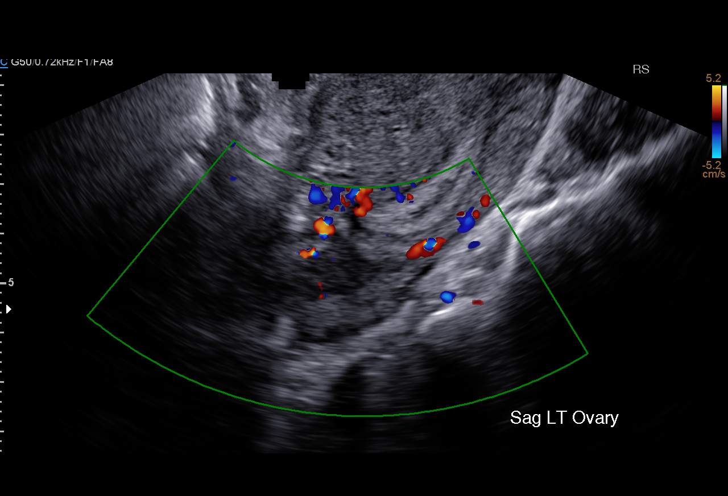
[im 63/69]
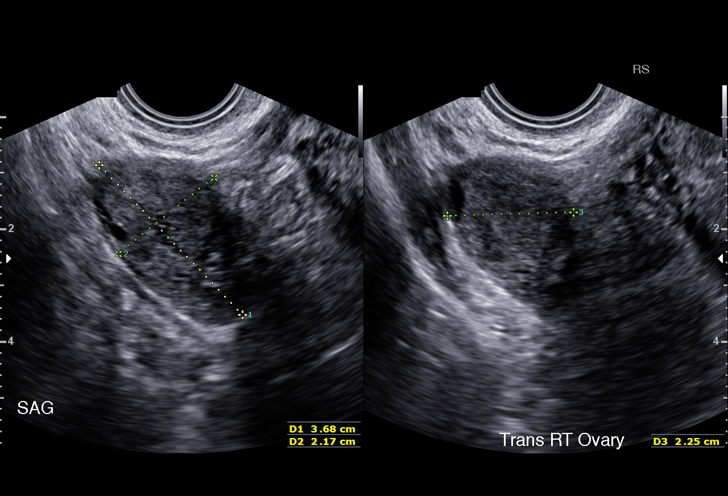
[im 69/69]
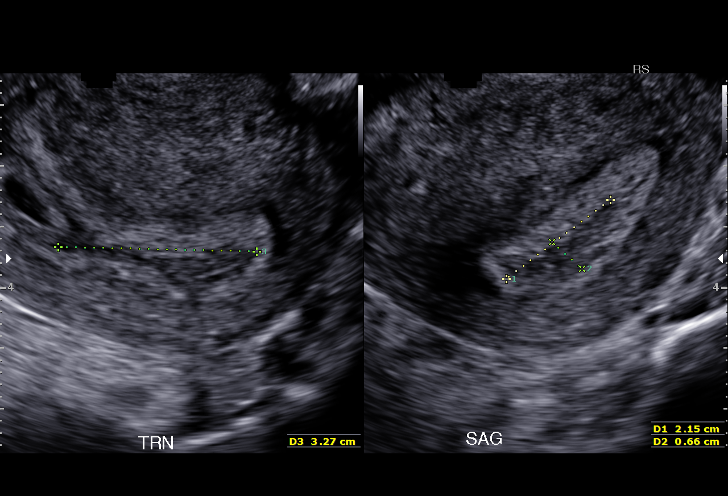

[15 of 28 positions shown; findings below may reference images not displayed]

FINDINGS: Intrauterine gestational sac: Visualized/normal in shape.

Yolk sac:  Yes

Embryo:  No

Cardiac Activity: N/A

MSD: 1.10 cm   5 w   6  d

Maternal uterus/adnexae: A small amount of subchorionic hemorrhage
is noted, measuring 3.3 x 2.2 x 0.7 cm. The uterus is otherwise
unremarkable in appearance.

The ovaries are within normal limits. The right ovary measures 3.7 x
2.2 x 2.3 cm, while the left ovary measures 3.4 x 1.8 x 2.1 cm. No
suspicious adnexal masses are seen; there is no evidence for ovarian
torsion.

No free fluid is seen in the pelvic cul-de-sac.
IMPRESSION: 1. Single intrauterine gestational sac noted, with a mean sac
diameter of 1.1 cm, corresponding to a gestational age of 5 weeks 6
days. This matches the gestational age by LMP of 6 weeks 2 days,
reflecting an estimated date of delivery December 29, 2015.
2. Small amount of subchorionic hemorrhage noted.

## 2017-04-22 IMAGING — US US MFM OB FOLLOW-UP
1 series · 14 of 28 positions shown · non-contrast
Comparison: none

[Series 1: us mfm ob follow-up · 14 of 51 slices shown]
[im 2/51]
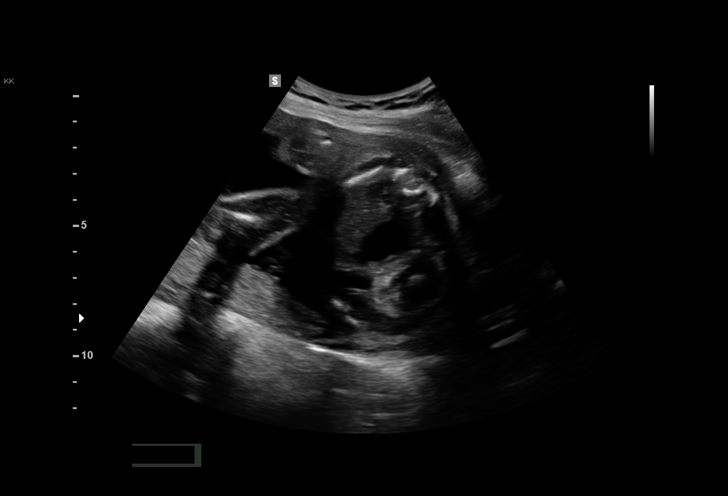
[im 6/51]
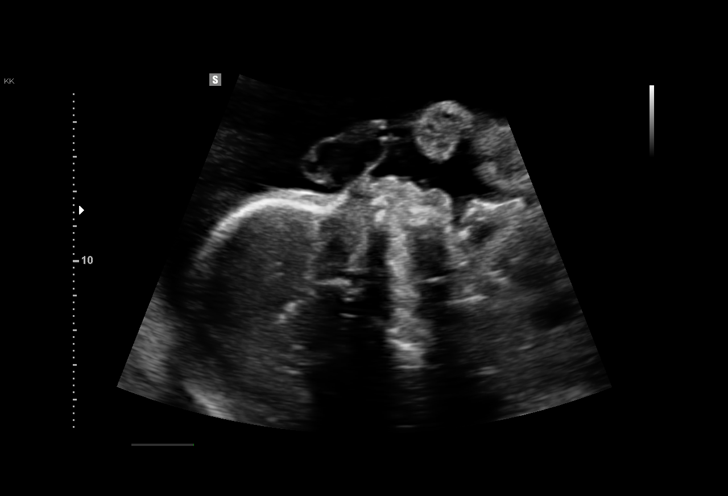
[im 10/51]
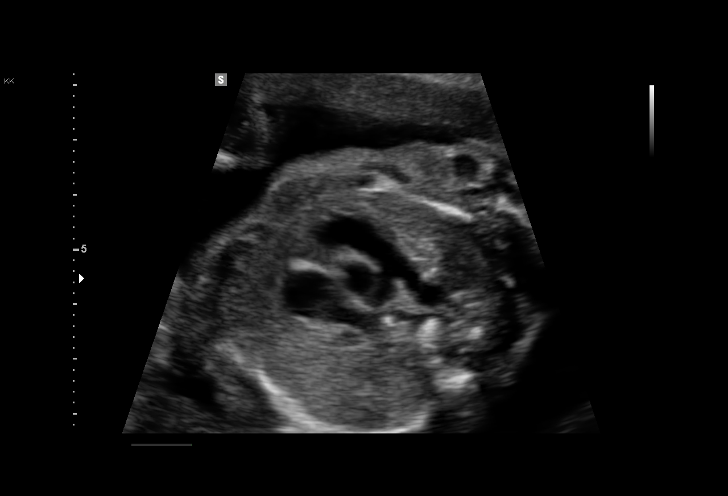
[im 13/51]
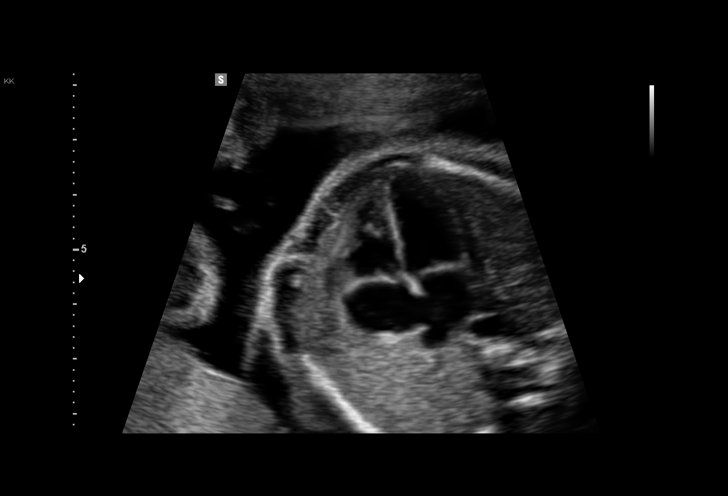
[im 17/51]
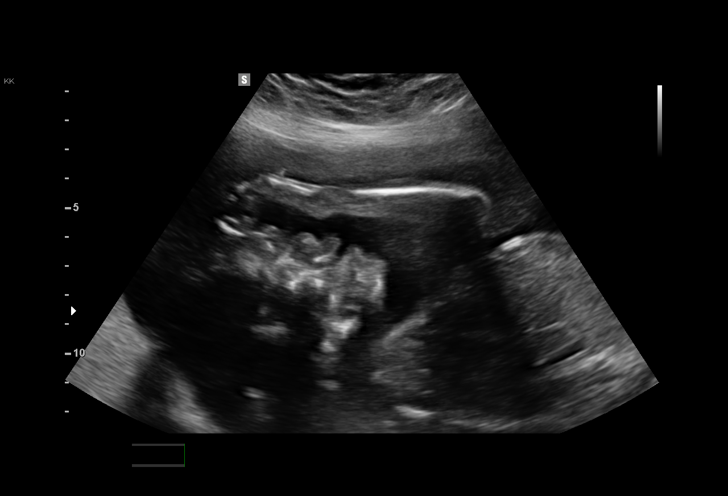
[im 21/51]
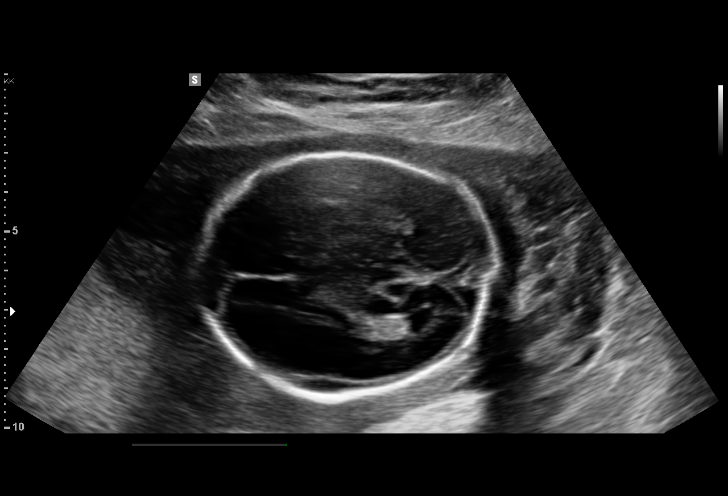
[im 25/51]
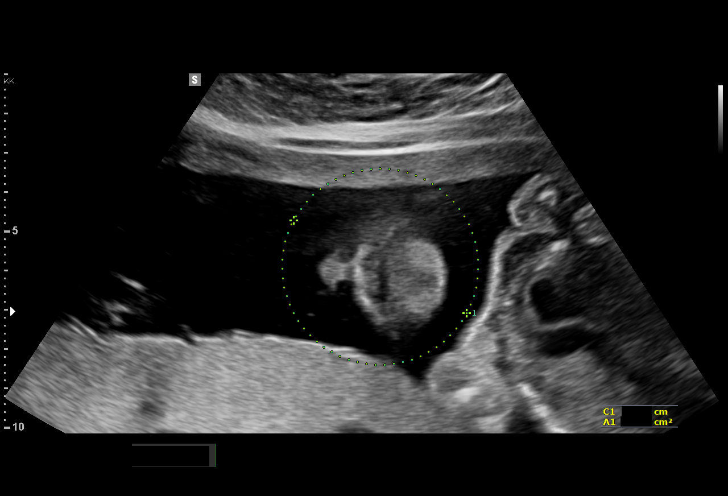
[im 28/51]
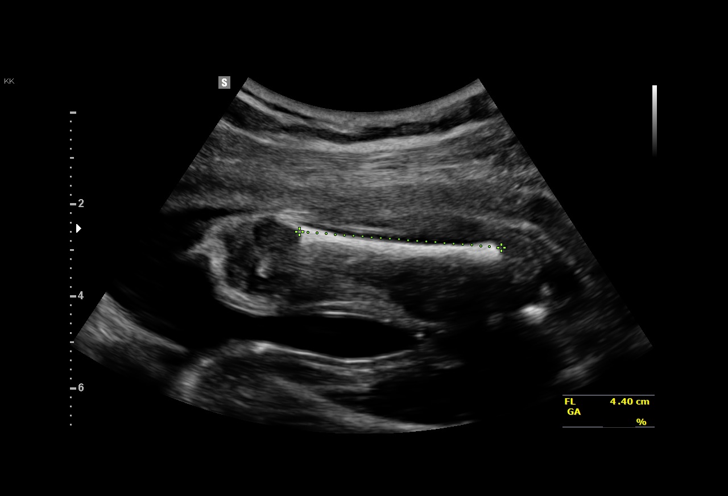
[im 32/51]
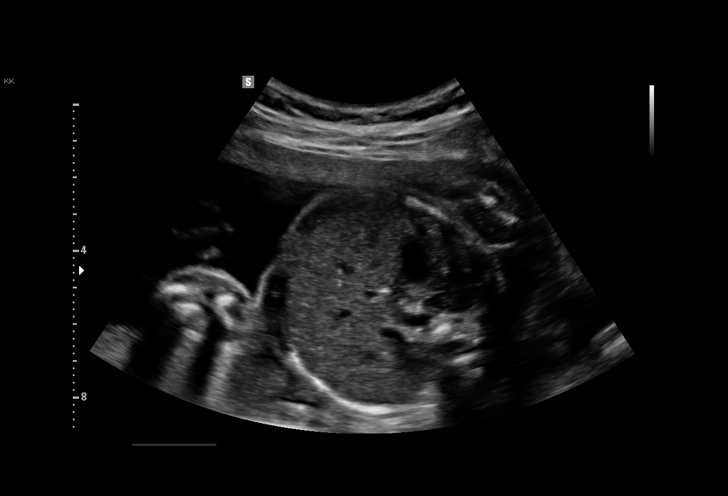
[im 36/51]
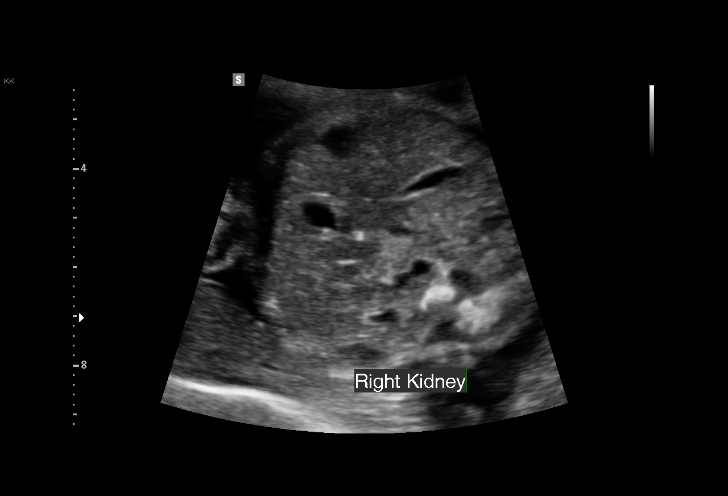
[im 39/51]
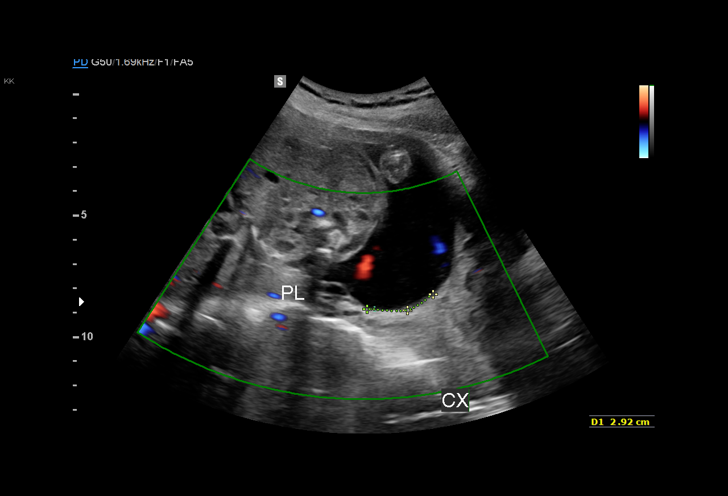
[im 43/51]
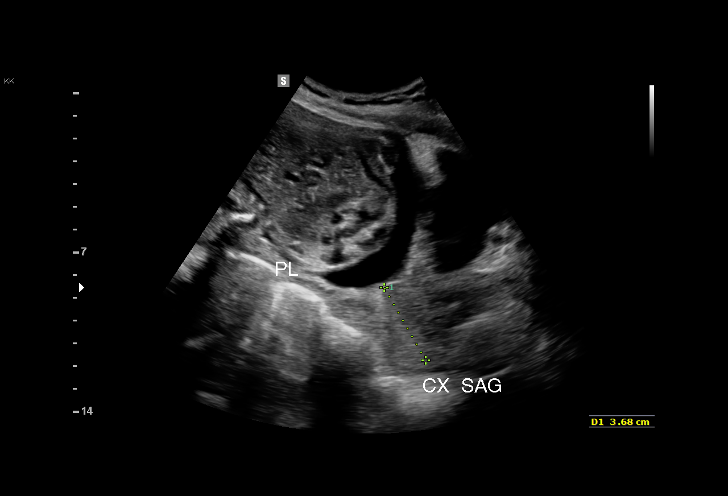
[im 47/51]
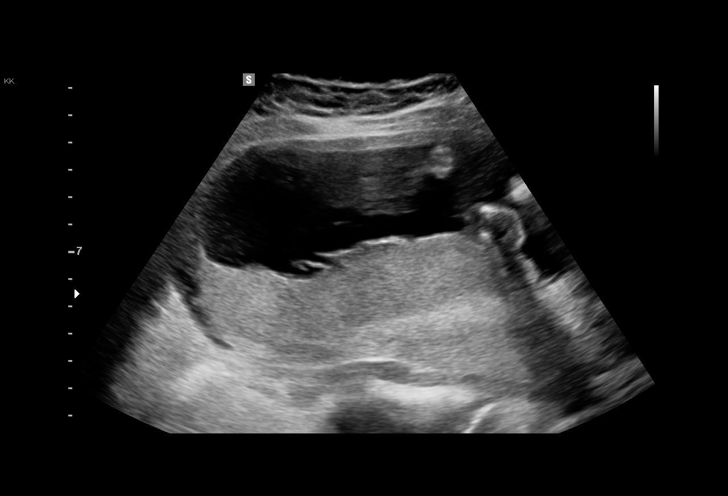
[im 51/51]
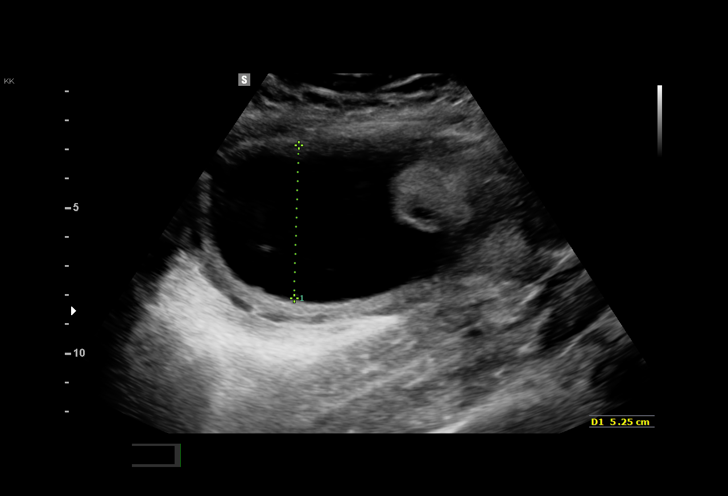

[14 of 28 positions shown; findings below may reference images not displayed]

[facility]-
Faculty Physician
OB/Gyn Clinic

1  PAMELA INES HEISE                [id number]      [id number]     [id number]
Indications

25 weeks gestation of pregnancy
Follow-up incomplete fetal anatomic            Z36
evaluation
Low lying placenta, antepartum
OB History

Height:       5'6"    Weight:   182       BMI:
Gravidity:    3         Term:   1
TOP:          1        Living:  1
Fetal Evaluation

Num Of Fetuses:     1
Fetal Heart         149
Rate(bpm):
Cardiac Activity:   Observed
Presentation:       Breech
Placenta:           Posterior, above cervical os
P. Cord Insertion:  Previously Visualized

Amniotic Fluid
AFI FV:      Subjectively within normal limits
Larg Pckt:     5.3  cm
Biometry

BPD:      60.2  mm     G. Age:  24w 4d                  CI:        69.45   %   70 - 86
FL/HC:      19.6   %   18.7 -
HC:      230.6  mm     G. Age:  25w 0d         22  %    HC/AC:      1.10       1.04 -
AC:      209.2  mm     G. Age:  25w 4d         47  %    FL/BPD:     75.2   %   71 - 87
FL:       45.3  mm     G. Age:  25w 0d         27  %    FL/AC:      21.7   %   20 - 24

Est. FW:     782  gm    1 lb 12 oz      52  %
Gestational Age

LMP:           25w 2d       Date:   03/24/15                 EDD:   12/29/15
U/S Today:     25w 0d                                        EDD:   12/31/15
Best:          25w 2d    Det. By:   LMP  (03/24/15)          EDD:   12/29/15
Anatomy

Cranium:          Appears normal         Aortic Arch:      Previously seen
Fetal Cavum:      Previously seen        Ductal Arch:      Previously seen
Ventricles:       Appears normal         Diaphragm:        Previously seen
Choroid Plexus:   Previously seen        Stomach:          Appears normal, left
sided
Cerebellum:       Appears normal         Abdomen:          Appears normal
Posterior Fossa:  Appears normal         Abdominal Wall:   Previously seen
Nuchal Fold:      Previously seen        Cord Vessels:     Previously seen
Face:             Orbits previously      Kidneys:          Appear normal
seen
Lips:             Appears normal         Bladder:          Appears normal
Fetal Thoracic:   Appears normal         Spine:            Previously seen
Heart:            Appears normal         Upper             Previously seen
(4CH, axis, and        Extremities:
situs)
RVOT:             Appears normal         Lower             Previously seen
Extremities:
LVOT:             Appears normal

Other:  Heels previously seen. Profile appears normal.
Cervix Uterus Adnexa

Cervix
Length:            3.5  cm.
Normal appearance by transabdominal scan.

Adnexa:       No abnormality visualized.
Impression

Single living intrauterine pregnancy at 25 weeks 2 days.
Appropriate fetal growth (52%).
Normal amniotic fluid volume.
The fetal anatomic survey is complete.
Normal fetal anatomy.
No fetal anomalies or soft markers of aneuploidy seen.
Resolved posterior low-lying placenta.
Recommendations

Follow-up ultrasounds as clinically indicated.
